# Patient Record
Sex: Female | Born: 1935 | Race: White | Hispanic: No
Health system: Southern US, Community
[De-identification: ages and names within clinical notes are randomized; demographics above are authoritative.]

---

## 2008-12-14 ENCOUNTER — Emergency Department: Payer: Self-pay | Admitting: Emergency Medicine

## 2009-10-11 ENCOUNTER — Emergency Department: Payer: Self-pay | Admitting: Emergency Medicine

## 2010-09-04 ENCOUNTER — Inpatient Hospital Stay: Payer: Self-pay | Admitting: *Deleted

## 2011-03-22 ENCOUNTER — Emergency Department: Payer: Self-pay | Admitting: Emergency Medicine

## 2011-03-22 LAB — BASIC METABOLIC PANEL
Anion Gap: 11 (ref 7–16)
BUN: 28 mg/dL — ABNORMAL HIGH (ref 7–18)
Calcium, Total: 8.4 mg/dL — ABNORMAL LOW (ref 8.5–10.1)
Chloride: 103 mmol/L (ref 98–107)
Glucose: 120 mg/dL — ABNORMAL HIGH (ref 65–99)
Osmolality: 297 (ref 275–301)
Potassium: 3.8 mmol/L (ref 3.5–5.1)

## 2011-03-22 LAB — CBC
HCT: 30.4 % — ABNORMAL LOW (ref 35.0–47.0)
MCHC: 32.9 g/dL (ref 32.0–36.0)
RBC: 3.41 10*6/uL — ABNORMAL LOW (ref 3.80–5.20)
RDW: 15.9 % — ABNORMAL HIGH (ref 11.5–14.5)
WBC: 5.1 10*3/uL (ref 3.6–11.0)

## 2011-03-22 LAB — PROTIME-INR: Prothrombin Time: 26.6 secs — ABNORMAL HIGH (ref 11.5–14.7)

## 2011-04-10 LAB — COMPREHENSIVE METABOLIC PANEL
Anion Gap: 12 (ref 7–16)
Bilirubin,Total: 0.6 mg/dL (ref 0.2–1.0)
Co2: 26 mmol/L (ref 21–32)
EGFR (Non-African Amer.): 44 — ABNORMAL LOW
Glucose: 145 mg/dL — ABNORMAL HIGH (ref 65–99)
SGOT(AST): 38 U/L — ABNORMAL HIGH (ref 15–37)
SGPT (ALT): 26 U/L
Sodium: 143 mmol/L (ref 136–145)

## 2011-04-10 LAB — CBC
HCT: 33 % — ABNORMAL LOW (ref 35.0–47.0)
Platelet: 139 10*3/uL — ABNORMAL LOW (ref 150–440)
RBC: 3.61 10*6/uL — ABNORMAL LOW (ref 3.80–5.20)
RDW: 16.5 % — ABNORMAL HIGH (ref 11.5–14.5)
WBC: 6.4 10*3/uL (ref 3.6–11.0)

## 2011-04-10 LAB — CK TOTAL AND CKMB (NOT AT ARMC)
CK, Total: 61 U/L (ref 21–215)
CK-MB: 1.5 ng/mL (ref 0.5–3.6)

## 2011-04-11 ENCOUNTER — Inpatient Hospital Stay: Payer: Self-pay | Admitting: Internal Medicine

## 2011-04-11 DIAGNOSIS — I5023 Acute on chronic systolic (congestive) heart failure: Secondary | ICD-10-CM

## 2011-04-11 DIAGNOSIS — I4892 Unspecified atrial flutter: Secondary | ICD-10-CM

## 2011-04-11 LAB — COMPREHENSIVE METABOLIC PANEL
Alkaline Phosphatase: 60 U/L (ref 50–136)
Bilirubin,Total: 0.7 mg/dL (ref 0.2–1.0)
Calcium, Total: 8.8 mg/dL (ref 8.5–10.1)
Chloride: 100 mmol/L (ref 98–107)
Co2: 32 mmol/L (ref 21–32)
EGFR (African American): 47 — ABNORMAL LOW
Osmolality: 287 (ref 275–301)
SGPT (ALT): 24 U/L
Total Protein: 6.8 g/dL (ref 6.4–8.2)

## 2011-04-11 LAB — CK TOTAL AND CKMB (NOT AT ARMC)
CK, Total: 42 U/L (ref 21–215)
CK, Total: 47 U/L (ref 21–215)
CK-MB: 1.5 ng/mL (ref 0.5–3.6)

## 2011-04-12 LAB — CBC WITH DIFFERENTIAL/PLATELET
Basophil #: 0 10*3/uL (ref 0.0–0.1)
Basophil %: 0.3 %
Eosinophil #: 0 10*3/uL (ref 0.0–0.7)
Eosinophil %: 0.1 %
Lymphocyte %: 16.9 %
Monocyte %: 7.7 %
Neutrophil #: 8.1 10*3/uL — ABNORMAL HIGH (ref 1.4–6.5)
RDW: 16.4 % — ABNORMAL HIGH (ref 11.5–14.5)
WBC: 10.8 10*3/uL (ref 3.6–11.0)

## 2011-04-12 LAB — BASIC METABOLIC PANEL
Anion Gap: 8 (ref 7–16)
BUN: 31 mg/dL — ABNORMAL HIGH (ref 7–18)
Calcium, Total: 9 mg/dL (ref 8.5–10.1)
Chloride: 98 mmol/L (ref 98–107)
Co2: 35 mmol/L — ABNORMAL HIGH (ref 21–32)
EGFR (Non-African Amer.): 25 — ABNORMAL LOW
Glucose: 120 mg/dL — ABNORMAL HIGH (ref 65–99)
Osmolality: 289 (ref 275–301)
Potassium: 4.3 mmol/L (ref 3.5–5.1)
Sodium: 141 mmol/L (ref 136–145)

## 2011-04-12 LAB — MAGNESIUM: Magnesium: 2.2 mg/dL

## 2011-04-12 LAB — TSH: Thyroid Stimulating Horm: 5.42 u[IU]/mL — ABNORMAL HIGH

## 2011-04-13 LAB — BASIC METABOLIC PANEL
BUN: 37 mg/dL — ABNORMAL HIGH (ref 7–18)
Co2: 31 mmol/L (ref 21–32)
EGFR (Non-African Amer.): 26 — ABNORMAL LOW
Glucose: 136 mg/dL — ABNORMAL HIGH (ref 65–99)
Osmolality: 286 (ref 275–301)
Potassium: 4.5 mmol/L (ref 3.5–5.1)
Sodium: 138 mmol/L (ref 136–145)

## 2011-04-14 LAB — BASIC METABOLIC PANEL
BUN: 35 mg/dL — ABNORMAL HIGH (ref 7–18)
Calcium, Total: 8.6 mg/dL (ref 8.5–10.1)
Chloride: 98 mmol/L (ref 98–107)
Co2: 32 mmol/L (ref 21–32)
EGFR (African American): 36 — ABNORMAL LOW
Osmolality: 284 (ref 275–301)

## 2011-06-03 ENCOUNTER — Emergency Department: Payer: Self-pay | Admitting: Emergency Medicine

## 2011-06-03 LAB — URINALYSIS, COMPLETE
Bacteria: NONE SEEN
Bilirubin,UR: NEGATIVE
Nitrite: NEGATIVE
Protein: NEGATIVE
RBC,UR: 5 /HPF (ref 0–5)
Squamous Epithelial: <1
WBC UR: 1 /HPF (ref 0–5)

## 2011-06-03 LAB — COMPREHENSIVE METABOLIC PANEL
Alkaline Phosphatase: 71 U/L (ref 50–136)
Anion Gap: 11 (ref 7–16)
Bilirubin,Total: 0.4 mg/dL (ref 0.2–1.0)
Calcium, Total: 8.6 mg/dL (ref 8.5–10.1)
Chloride: 101 mmol/L (ref 98–107)
Glucose: 133 mg/dL — ABNORMAL HIGH (ref 65–99)
Osmolality: 283 (ref 275–301)
SGOT(AST): 40 U/L — ABNORMAL HIGH (ref 15–37)
SGPT (ALT): 28 U/L
Total Protein: 7 g/dL (ref 6.4–8.2)

## 2011-06-03 LAB — CBC
HCT: 32.8 % — ABNORMAL LOW (ref 35.0–47.0)
HGB: 10.7 g/dL — ABNORMAL LOW (ref 12.0–16.0)
MCH: 29.7 pg (ref 26.0–34.0)
MCHC: 32.7 g/dL (ref 32.0–36.0)
MCV: 91 fL (ref 80–100)
RBC: 3.62 10*6/uL — ABNORMAL LOW (ref 3.80–5.20)
WBC: 7 10*3/uL (ref 3.6–11.0)

## 2011-07-04 ENCOUNTER — Ambulatory Visit: Payer: Self-pay | Admitting: Internal Medicine

## 2011-07-28 ENCOUNTER — Emergency Department: Payer: Self-pay | Admitting: Emergency Medicine

## 2011-07-30 ENCOUNTER — Inpatient Hospital Stay: Payer: Self-pay | Admitting: Internal Medicine

## 2011-07-30 LAB — COMPREHENSIVE METABOLIC PANEL
Anion Gap: 10 (ref 7–16)
BUN: 30 mg/dL — ABNORMAL HIGH (ref 7–18)
Bilirubin,Total: 0.5 mg/dL (ref 0.2–1.0)
Chloride: 105 mmol/L (ref 98–107)
Creatinine: 2.52 mg/dL — ABNORMAL HIGH (ref 0.60–1.30)
EGFR (African American): 19 — ABNORMAL LOW
EGFR (Non-African Amer.): 16 — ABNORMAL LOW
Glucose: 110 mg/dL — ABNORMAL HIGH (ref 65–99)
Potassium: 4.3 mmol/L (ref 3.5–5.1)
SGPT (ALT): 22 U/L
Total Protein: 7 g/dL (ref 6.4–8.2)

## 2011-07-30 LAB — CBC
MCH: 29.3 pg (ref 26.0–34.0)
MCHC: 33.7 g/dL (ref 32.0–36.0)
MCV: 87 fL (ref 80–100)
Platelet: 141 10*3/uL — ABNORMAL LOW (ref 150–440)
RBC: 3.41 10*6/uL — ABNORMAL LOW (ref 3.80–5.20)
WBC: 6.1 10*3/uL (ref 3.6–11.0)

## 2011-07-30 LAB — URINALYSIS, COMPLETE
Bilirubin,UR: NEGATIVE
Blood: NEGATIVE
Glucose,UR: NEGATIVE mg/dL (ref 0–75)
Leukocyte Esterase: NEGATIVE
Specific Gravity: 1.014 (ref 1.003–1.030)
Squamous Epithelial: <1
WBC UR: 2 /HPF (ref 0–5)

## 2011-07-31 LAB — COMPREHENSIVE METABOLIC PANEL
Albumin: 3.5 g/dL (ref 3.4–5.0)
Alkaline Phosphatase: 73 U/L (ref 50–136)
Anion Gap: 11 (ref 7–16)
BUN: 29 mg/dL — ABNORMAL HIGH (ref 7–18)
Bilirubin,Total: 0.5 mg/dL (ref 0.2–1.0)
Calcium, Total: 8.7 mg/dL (ref 8.5–10.1)
Chloride: 105 mmol/L (ref 98–107)
EGFR (African American): 20 — ABNORMAL LOW
EGFR (Non-African Amer.): 18 — ABNORMAL LOW
Glucose: 182 mg/dL — ABNORMAL HIGH (ref 65–99)
Osmolality: 292 (ref 275–301)
Potassium: 4.1 mmol/L (ref 3.5–5.1)
SGOT(AST): 42 U/L — ABNORMAL HIGH (ref 15–37)
Sodium: 141 mmol/L (ref 136–145)
Total Protein: 6.8 g/dL (ref 6.4–8.2)

## 2011-07-31 LAB — CBC WITH DIFFERENTIAL/PLATELET
Basophil #: 0 10*3/uL (ref 0.0–0.1)
Basophil %: 0.2 %
Eosinophil #: 0 10*3/uL (ref 0.0–0.7)
Eosinophil %: 0.1 %
HCT: 27.3 % — ABNORMAL LOW (ref 35.0–47.0)
HGB: 9.4 g/dL — ABNORMAL LOW (ref 12.0–16.0)
Lymphocyte #: 0.6 10*3/uL — ABNORMAL LOW (ref 1.0–3.6)
Lymphocyte %: 19.2 %
MCH: 30.1 pg (ref 26.0–34.0)
MCHC: 34.6 g/dL (ref 32.0–36.0)
MCV: 87 fL (ref 80–100)
Monocyte #: 0 x10 3/mm — ABNORMAL LOW (ref 0.2–0.9)
Monocyte %: 1.2 %
Neutrophil #: 2.3 10*3/uL (ref 1.4–6.5)
Neutrophil %: 79.3 %
RBC: 3.14 10*6/uL — ABNORMAL LOW (ref 3.80–5.20)
WBC: 2.9 10*3/uL — ABNORMAL LOW (ref 3.6–11.0)

## 2011-08-01 LAB — CBC WITH DIFFERENTIAL/PLATELET
Basophil #: 0 10*3/uL (ref 0.0–0.1)
Basophil %: 0.1 %
Eosinophil #: 0 10*3/uL (ref 0.0–0.7)
Eosinophil %: 0.1 %
HCT: 27.3 % — ABNORMAL LOW (ref 35.0–47.0)
HGB: 9.4 g/dL — ABNORMAL LOW (ref 12.0–16.0)
Lymphocyte %: 8 %
Monocyte %: 3.4 %
Neutrophil #: 6.5 10*3/uL (ref 1.4–6.5)
Neutrophil %: 88.4 %
RBC: 3.13 10*6/uL — ABNORMAL LOW (ref 3.80–5.20)
WBC: 7.4 10*3/uL (ref 3.6–11.0)

## 2011-08-01 LAB — BASIC METABOLIC PANEL
BUN: 32 mg/dL — ABNORMAL HIGH (ref 7–18)
Chloride: 103 mmol/L (ref 98–107)
Creatinine: 2.07 mg/dL — ABNORMAL HIGH (ref 0.60–1.30)
EGFR (Non-African Amer.): 21 — ABNORMAL LOW
Glucose: 183 mg/dL — ABNORMAL HIGH (ref 65–99)
Potassium: 3.8 mmol/L (ref 3.5–5.1)
Sodium: 140 mmol/L (ref 136–145)

## 2011-08-04 ENCOUNTER — Ambulatory Visit: Payer: Self-pay | Admitting: Internal Medicine

## 2011-08-05 LAB — CULTURE, BLOOD (SINGLE)

## 2011-09-09 ENCOUNTER — Emergency Department: Payer: Self-pay | Admitting: Emergency Medicine

## 2011-12-30 ENCOUNTER — Emergency Department: Payer: Self-pay | Admitting: Emergency Medicine

## 2012-05-07 ENCOUNTER — Emergency Department: Payer: Self-pay | Admitting: Emergency Medicine

## 2012-05-07 LAB — PRO B NATRIURETIC PEPTIDE: B-Type Natriuretic Peptide: 1658 pg/mL — ABNORMAL HIGH (ref 0–450)

## 2012-05-07 LAB — CBC
RBC: 3.92 10*6/uL (ref 3.80–5.20)
RDW: 15.4 % — ABNORMAL HIGH (ref 11.5–14.5)
WBC: 7.1 10*3/uL (ref 3.6–11.0)

## 2012-05-07 LAB — COMPREHENSIVE METABOLIC PANEL
Albumin: 3.5 g/dL (ref 3.4–5.0)
Alkaline Phosphatase: 79 U/L (ref 50–136)
Anion Gap: 5 — ABNORMAL LOW (ref 7–16)
BUN: 22 mg/dL — ABNORMAL HIGH (ref 7–18)
Bilirubin,Total: 0.5 mg/dL (ref 0.2–1.0)
Creatinine: 1.43 mg/dL — ABNORMAL HIGH (ref 0.60–1.30)
EGFR (African American): 38 — ABNORMAL LOW
Glucose: 107 mg/dL — ABNORMAL HIGH (ref 65–99)
Potassium: 4.4 mmol/L (ref 3.5–5.1)
SGOT(AST): 29 U/L (ref 15–37)
SGPT (ALT): 16 U/L (ref 12–78)

## 2012-05-07 LAB — TROPONIN I: Troponin-I: <0.02 ng/mL

## 2012-05-13 LAB — CULTURE, BLOOD (SINGLE)

## 2012-06-15 ENCOUNTER — Emergency Department: Payer: Self-pay

## 2012-06-15 LAB — CBC
HCT: 33.8 % — ABNORMAL LOW (ref 35.0–47.0)
MCH: 30 pg (ref 26.0–34.0)
Platelet: 158 10*3/uL (ref 150–440)
RBC: 3.76 10*6/uL — ABNORMAL LOW (ref 3.80–5.20)

## 2012-06-15 LAB — COMPREHENSIVE METABOLIC PANEL
Albumin: 3.8 g/dL (ref 3.4–5.0)
Alkaline Phosphatase: 67 U/L (ref 50–136)
Anion Gap: 6 — ABNORMAL LOW (ref 7–16)
BUN: 23 mg/dL — ABNORMAL HIGH (ref 7–18)
Bilirubin,Total: 0.5 mg/dL (ref 0.2–1.0)
Co2: 30 mmol/L (ref 21–32)
Creatinine: 1.59 mg/dL — ABNORMAL HIGH (ref 0.60–1.30)
EGFR (African American): 33 — ABNORMAL LOW
Glucose: 132 mg/dL — ABNORMAL HIGH (ref 65–99)
SGOT(AST): 27 U/L (ref 15–37)
SGPT (ALT): 18 U/L (ref 12–78)
Sodium: 143 mmol/L (ref 136–145)
Total Protein: 6.6 g/dL (ref 6.4–8.2)

## 2012-06-15 LAB — TROPONIN I: Troponin-I: <0.02 ng/mL

## 2012-12-03 ENCOUNTER — Inpatient Hospital Stay: Payer: Self-pay | Admitting: Internal Medicine

## 2012-12-03 LAB — COMPREHENSIVE METABOLIC PANEL
Alkaline Phosphatase: 50 U/L
BUN: 39 mg/dL — ABNORMAL HIGH (ref 7–18)
Bilirubin,Total: 0.5 mg/dL (ref 0.2–1.0)
Calcium, Total: 8.5 mg/dL (ref 8.5–10.1)
Chloride: 105 mmol/L (ref 98–107)
Co2: 28 mmol/L (ref 21–32)
EGFR (African American): 37 — ABNORMAL LOW
Osmolality: 291 (ref 275–301)
SGPT (ALT): 18 U/L (ref 12–78)
Total Protein: 5.8 g/dL — ABNORMAL LOW (ref 6.4–8.2)

## 2012-12-03 LAB — URINALYSIS, COMPLETE
Bacteria: NONE SEEN
Bilirubin,UR: NEGATIVE
Glucose,UR: NEGATIVE mg/dL (ref 0–75)
Hyaline Cast: 2
Ketone: NEGATIVE
Leukocyte Esterase: NEGATIVE
Nitrite: NEGATIVE
Ph: 5 (ref 4.5–8.0)
Protein: NEGATIVE
RBC,UR: 16 /HPF (ref 0–5)
Specific Gravity: 1.016 (ref 1.003–1.030)
Squamous Epithelial: 1

## 2012-12-03 LAB — CBC
MCH: 29.3 pg (ref 26.0–34.0)
MCHC: 32.3 g/dL (ref 32.0–36.0)
MCV: 91 fL (ref 80–100)
Platelet: 180 10*3/uL (ref 150–440)

## 2012-12-03 LAB — CBC WITH DIFFERENTIAL/PLATELET
Eosinophil #: 0.1 10*3/uL (ref 0.0–0.7)
Eosinophil %: 0.7 %
HCT: 29.8 % — ABNORMAL LOW (ref 35.0–47.0)
Lymphocyte #: 2.9 10*3/uL (ref 1.0–3.6)
MCH: 30.8 pg (ref 26.0–34.0)
MCHC: 33.6 g/dL (ref 32.0–36.0)
MCV: 92 fL (ref 80–100)
Monocyte #: 1.5 x10 3/mm — ABNORMAL HIGH (ref 0.2–0.9)
Monocyte %: 7.7 %
Neutrophil %: 76.3 %
Platelet: 144 10*3/uL — ABNORMAL LOW (ref 150–440)
RDW: 14.5 % (ref 11.5–14.5)
WBC: 19.4 10*3/uL — ABNORMAL HIGH (ref 3.6–11.0)

## 2012-12-03 LAB — LIPASE, BLOOD: Lipase: 110 U/L (ref 73–393)

## 2012-12-03 LAB — PROTIME-INR: INR: 1

## 2012-12-03 LAB — TROPONIN I: Troponin-I: <0.02 ng/mL

## 2012-12-03 LAB — GASTROCCULT (ARMC)

## 2012-12-04 ENCOUNTER — Ambulatory Visit: Payer: Self-pay | Admitting: Internal Medicine

## 2012-12-04 LAB — CBC WITH DIFFERENTIAL/PLATELET
Basophil %: 0.2 %
Eosinophil %: 3.3 %
HGB: 9.1 g/dL — ABNORMAL LOW (ref 12.0–16.0)
Lymphocyte #: 2.4 10*3/uL (ref 1.0–3.6)
Lymphocyte %: 18.5 %
MCH: 30.7 pg (ref 26.0–34.0)
MCHC: 33.6 g/dL (ref 32.0–36.0)
MCV: 92 fL (ref 80–100)
Monocyte %: 7.6 %
Neutrophil #: 9.2 10*3/uL — ABNORMAL HIGH (ref 1.4–6.5)
Neutrophil %: 70.4 %
Platelet: 131 10*3/uL — ABNORMAL LOW (ref 150–440)
WBC: 13.1 10*3/uL — ABNORMAL HIGH (ref 3.6–11.0)

## 2012-12-04 LAB — BASIC METABOLIC PANEL
BUN: 32 mg/dL — ABNORMAL HIGH (ref 7–18)
Calcium, Total: 8.3 mg/dL — ABNORMAL LOW (ref 8.5–10.1)
Chloride: 108 mmol/L — ABNORMAL HIGH (ref 98–107)
Co2: 29 mmol/L (ref 21–32)
Creatinine: 1.34 mg/dL — ABNORMAL HIGH (ref 0.60–1.30)
EGFR (African American): 40 — ABNORMAL LOW
EGFR (Non-African Amer.): 35 — ABNORMAL LOW
Glucose: 101 mg/dL — ABNORMAL HIGH (ref 65–99)
Potassium: 3.9 mmol/L (ref 3.5–5.1)

## 2013-01-03 ENCOUNTER — Ambulatory Visit: Payer: Self-pay | Admitting: Internal Medicine

## 2013-06-05 IMAGING — CR DG CHEST 2V
1 series · 2 of 2 positions shown · non-contrast
Comparison: none

REASON FOR EXAM: f/u CHF
COMMENTS:

[Series 1: w chest pa · 0.14mm/px · 2 of 2 slices shown]
[im 1/2]
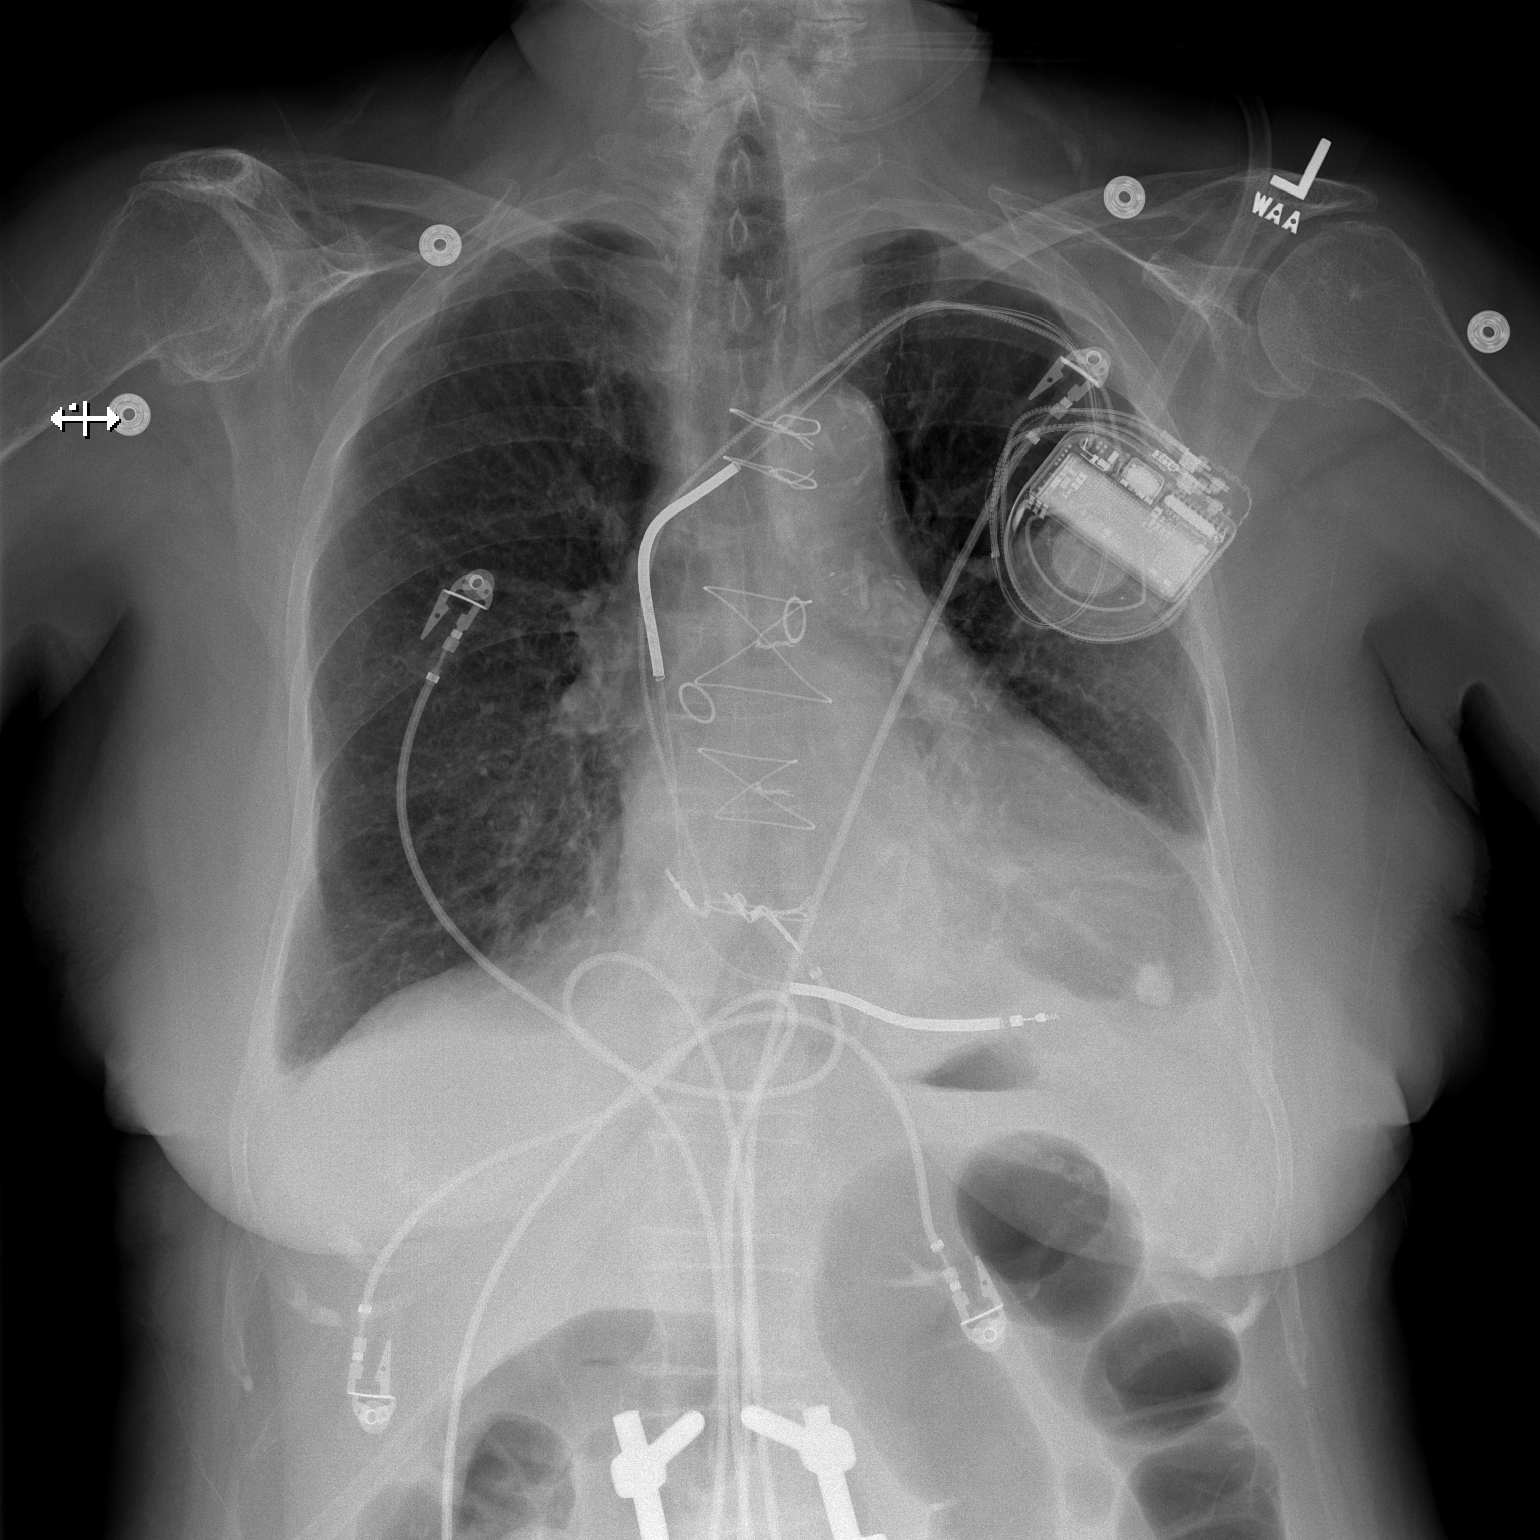
[im 2/2]
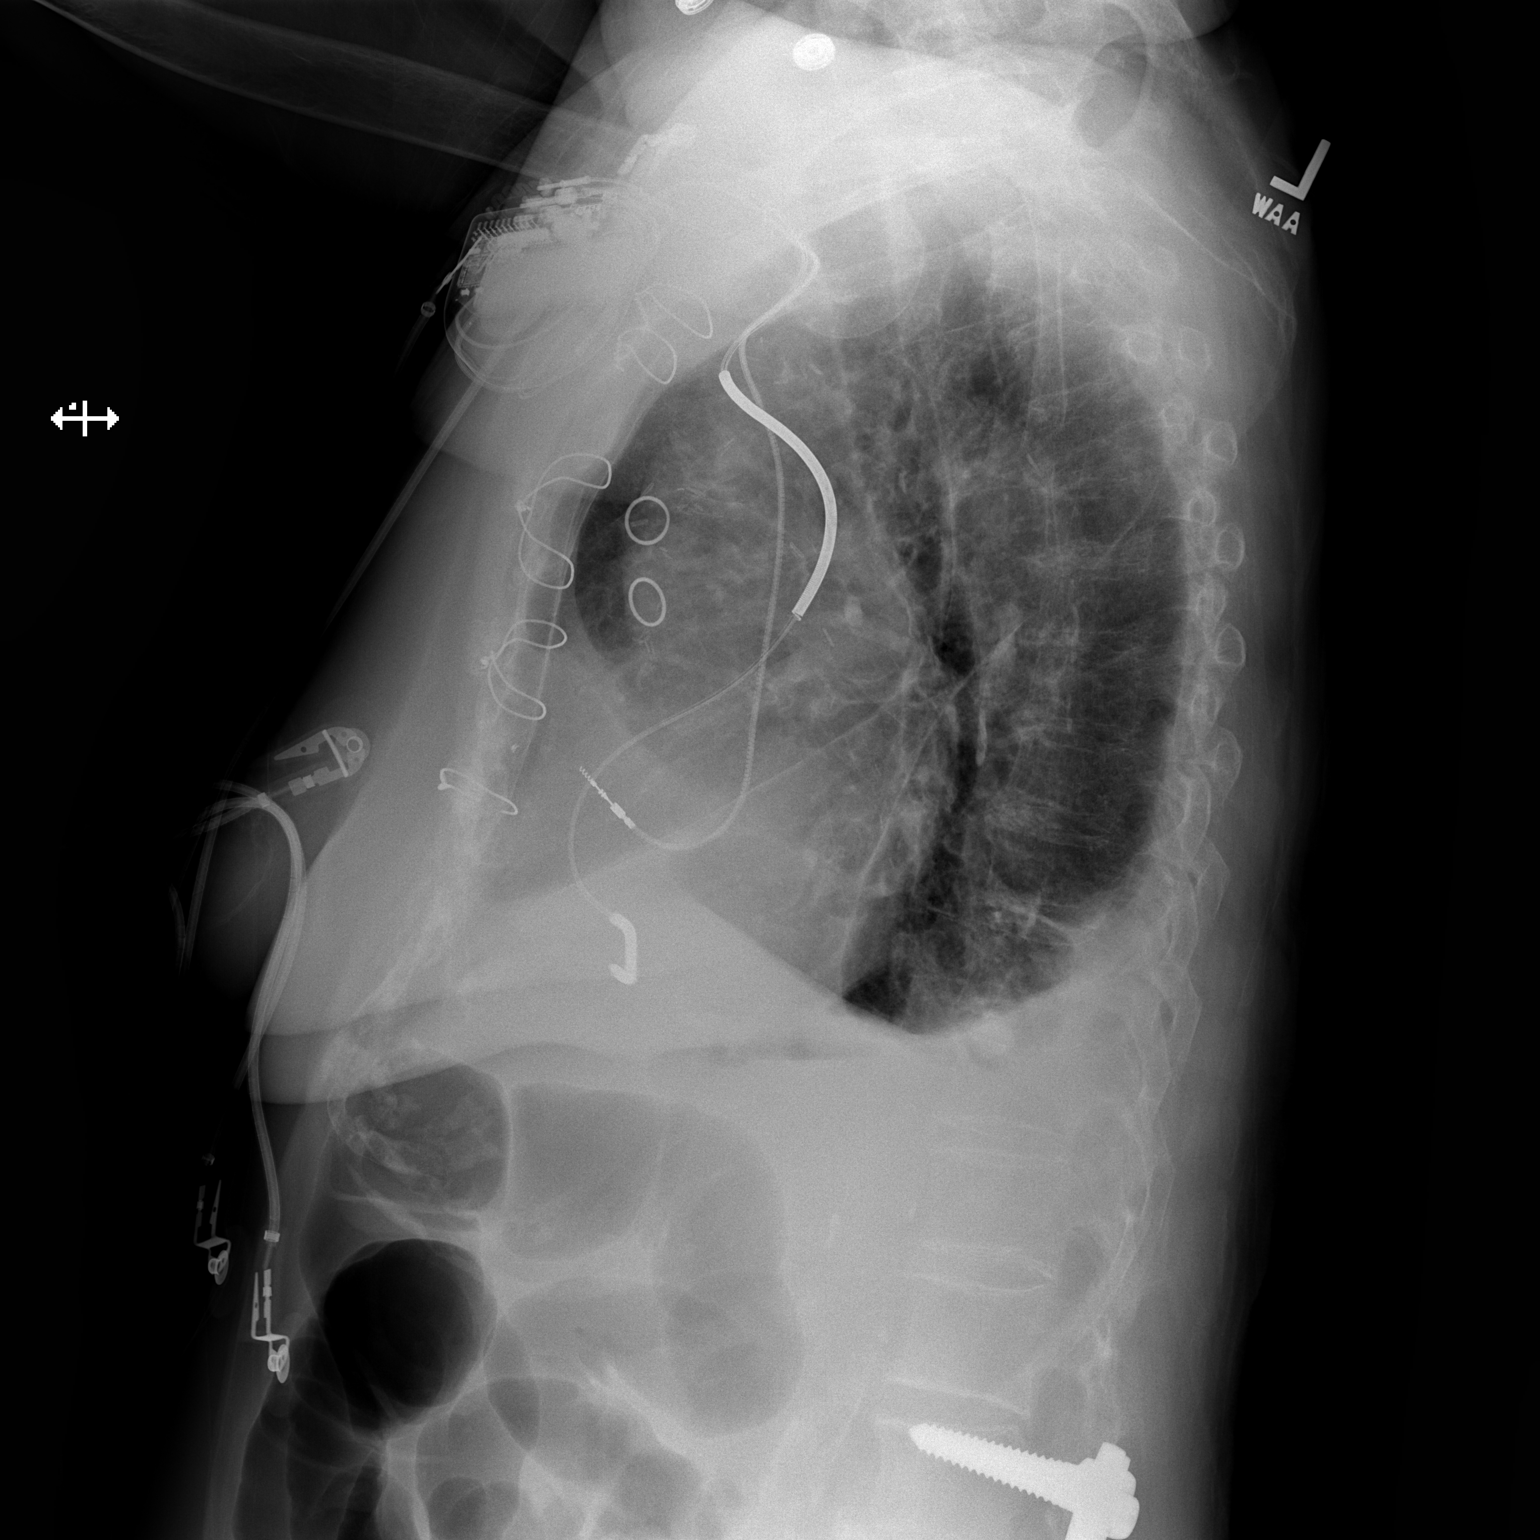

[2 of 2 positions shown; findings below may reference images not displayed]

PROCEDURE:     DXR - DXR CHEST PA (OR AP) AND LATERAL  - April 12, 2011 [DATE]

RESULT:     Comparison is made to the prior exam of 04/10/2011. There persists
small left pleural effusion that appears to have decreased as compared to
the exam of 04/10/2011. There is blunting of the right costophrenic angle
suspicious for a trace right effusion. There is again noted prominence of
the pulmonary vascularity compatible with pulmonary vascular congestion.
There has been little or no appreciable interval change since the prior
exam. No pneumonia is seen. There is mild stable cardiac enlargement.
Postoperative changes of prior CABG are noted. A cardiac pacemaker is
present.
IMPRESSION: 1. There are again noted mild changes of pulmonary vascular congestion.
2. There are small bibasilar pleural effusions with that on the left
appearing smaller than on the prior exam.
3. There is mild stable cardiac enlargement.
4. No definite pneumonia is identified.

## 2013-07-27 IMAGING — CT CT CERVICAL SPINE WITHOUT CONTRAST
1 series · 12 of 14 positions shown, 15 images · non-contrast
Comparison: none

REASON FOR EXAM: trauma, neck pain
COMMENTS:

PROCEDURE:     CT  - CT CERVICAL SPINE WO  - June 03, 2011  [DATE]
RESULT:     Comparison: 12/14/2008
TECHNIQUE: Multiple axial CT images were obtained of the cervical spine,
without intravenous contrast.  Sagittal and coronal reformatted images were
constructed.

[Series 7: axial · axial · 0.33mm/px · z∈[+152,+288]mm · 12 of 93 slices shown, 15 images]
[im 8/93  soft-tissue]
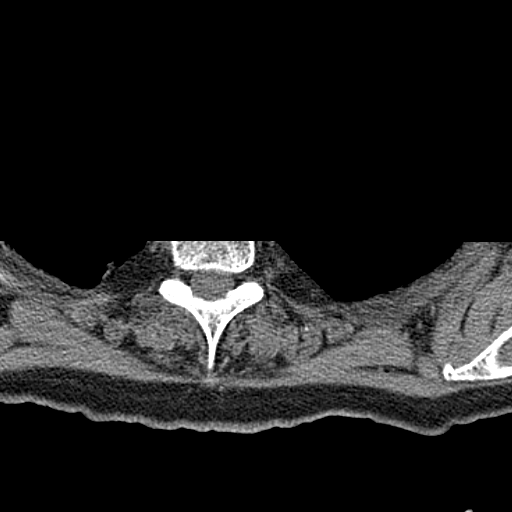
[im 8/93  bone]
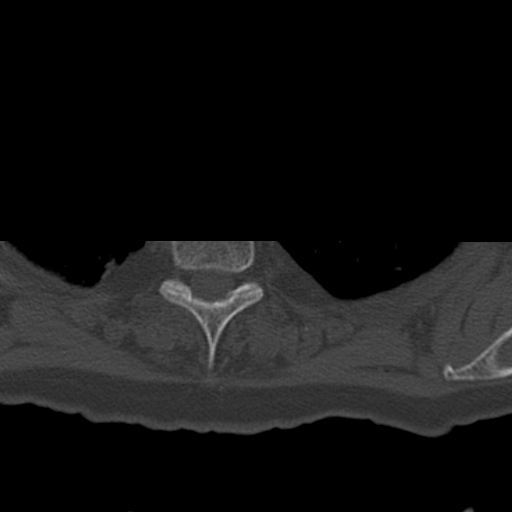
[im 15/93  bone]
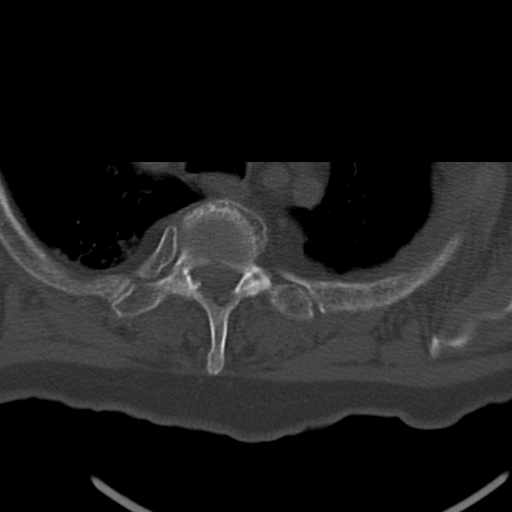
[im 22/93  bone]
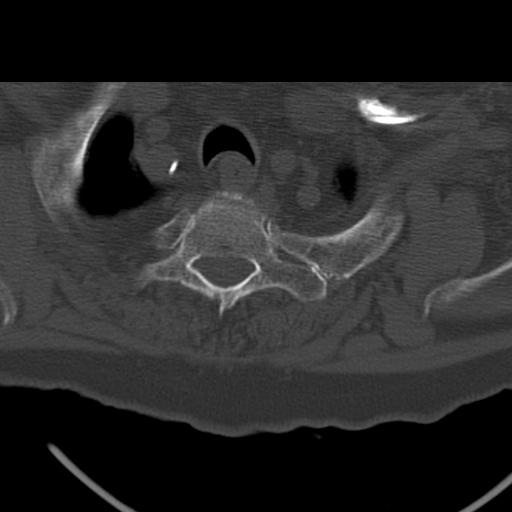
[im 29/93  bone]
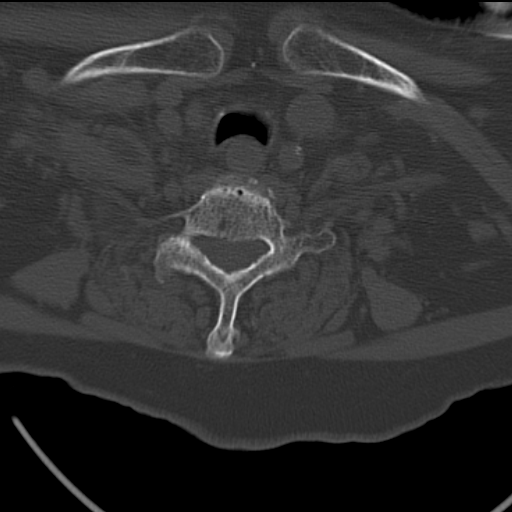
[im 36/93  soft-tissue]
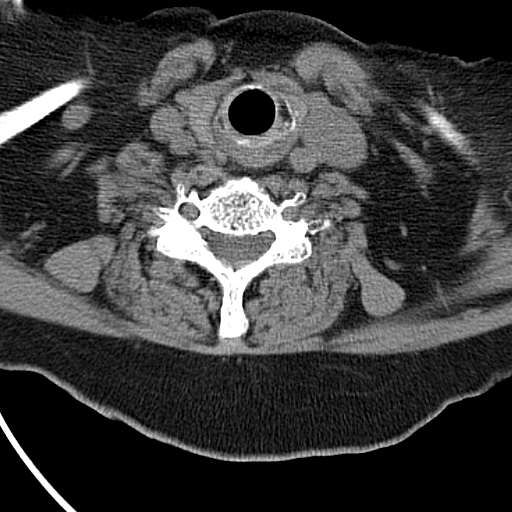
[im 36/93  bone]
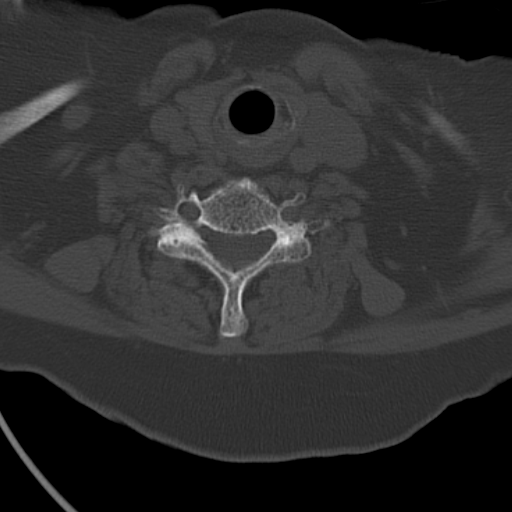
[im 43/93  bone]
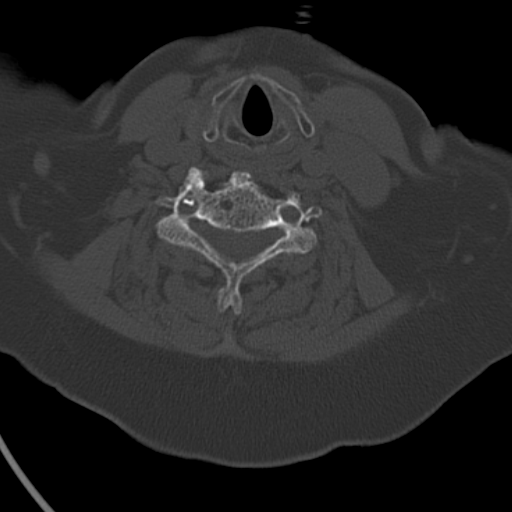
[im 50/93  bone]
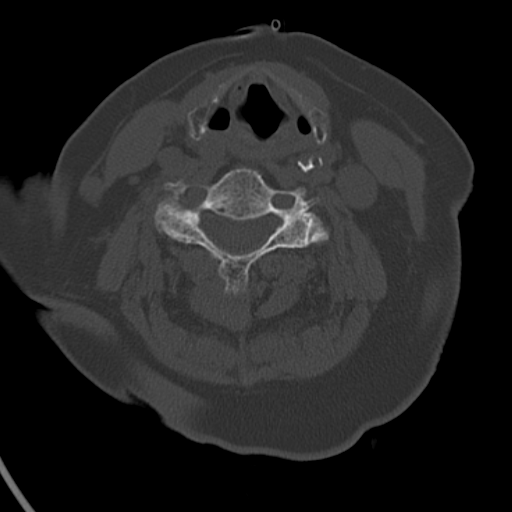
[im 57/93  bone]
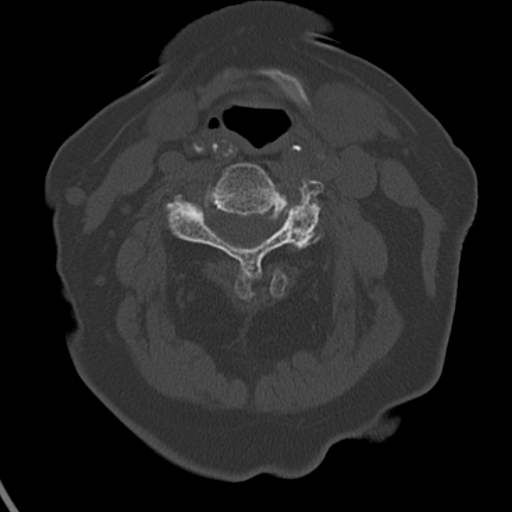
[im 64/93  soft-tissue]
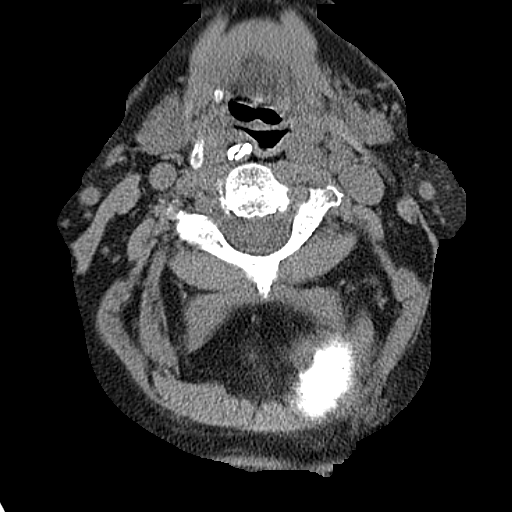
[im 64/93  bone]
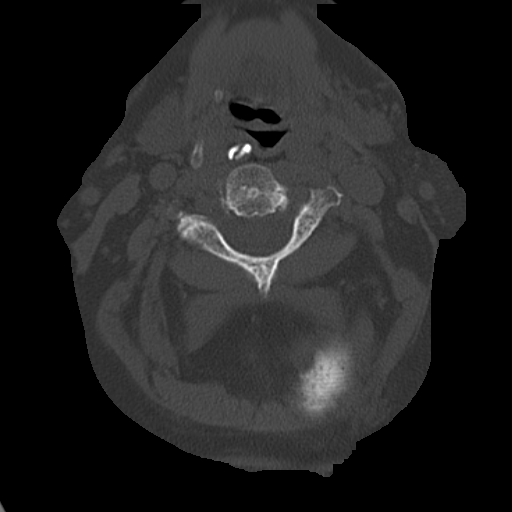
[im 71/93  bone]
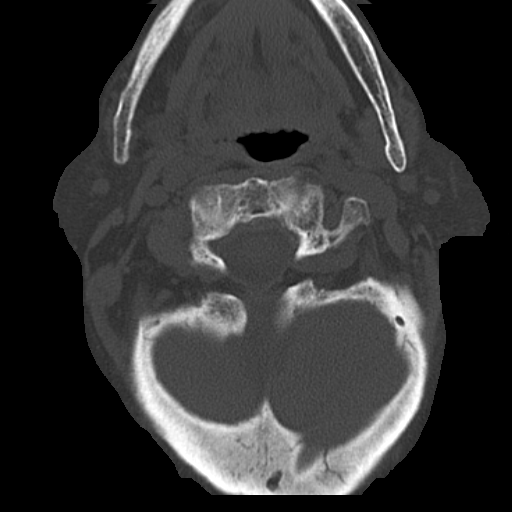
[im 78/93  bone]
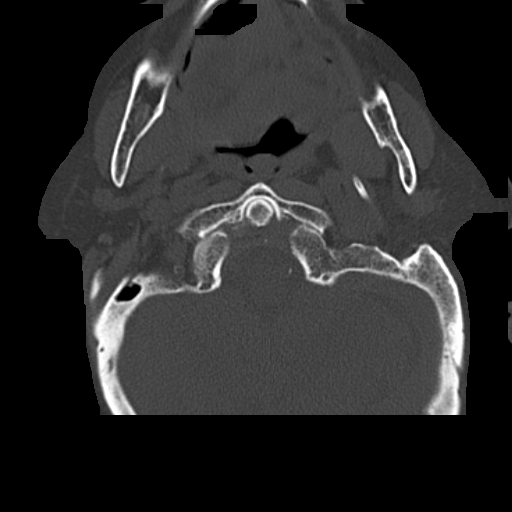
[im 85/93  bone]
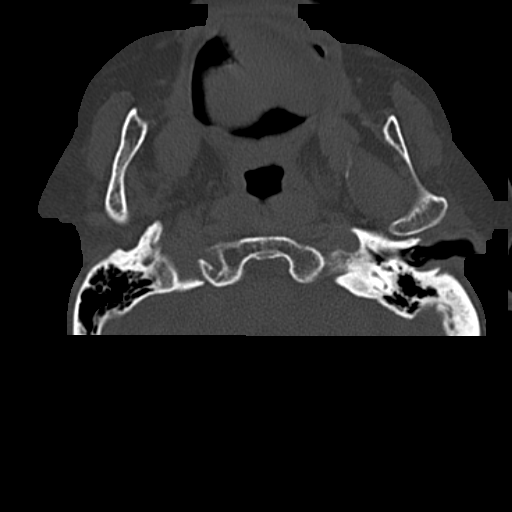

[12 of 14 positions shown; findings below may reference images not displayed]

FINDINGS: Minimal anterolisthesis of C3 on C4, C4 on C5 and C7 on T1 as well as
minimal retrolisthesis of C5 on C6 are similar to prior. No cervical spine
fracture identified. The prevertebral soft tissues are within normal limits.
There are calcifications in the coronary arteries. There is multilevel
degenerative disc disease.
IMPRESSION: No cervical spine fracture identified. Ligamentous injury is not excluded.

## 2014-01-03 DEATH — deceased

## 2014-04-22 NOTE — Discharge Summary (Signed)
PATIENT NAME:  Amber ComptonLEMA, Ece R MR#:  [id number] DATE OF BIRTH:  [birth year]  DATE OF ADMISSION:  07/30/2011 DATE OF DISCHARGE:  08/02/2011  DISCHARGE DIAGNOSES:  1. Delirium possibly due to medications and hypoxia. 2. Acute on chronic respiratory failure secondary to chronic obstructive pulmonary disease exacerbation.  3. Mild systolic congestive heart failure. 4. Acute renal failure on chronic kidney disease.  5. Coronary artery disease/ischemic cardiomyopathy. 6. Paroxysmal atrial fibrillation. 7. Hypothyroidism. 8. Gout. 9. Recurrent falls.   DISPOSITION: The patient is being discharged home with hospice.   DIET: Low sodium.   ACTIVITY: As tolerated.   DISCHARGE FOLLOWUP: Follow up with PCP in 1 to 2 weeks after discharge.   DISCHARGE MEDICATIONS:  1. Prednisone taper. 2. Levaquin 250 mg every 48 hours x3 doses. 3. Ocuvite lutein one tablet twice a day. 4. Symbicort 2 puffs twice a day. 5. Spiriva 18 mcg inhaled daily.  6. Nitroglycerin p.r.n.  7. Ergocalciferol 50,000 international units one 1 capsule once a week on Mondays. 8. DuoNebs every four hours p.r.n. shortness of breath.  9. Calcium 600  plus vitamin D 1 tablet daily. 10. Atrovent HFA 2 puffs twice a day. 11. Ventolin 2 puffs four times a day as needed for shortness of breath. 12. Morphine 30 mg 1/2 tablet 1 to 2 tablets a week as needed for pain. 13. Oxycodone 5 mg two to three times a day as needed.  14. Cardizem 120 mg daily.  15. Levoxyl 25 mcg daily.  16. Lyrica 50 mg twice a day. 17. Paxil 25 mg daily.  18. Colchicine 0.6 mg daily.  19. Lasix 40 mg daily.  20. Lipitor 10 mg daily. 21. Xarelto 50 mg daily.  22. Scopolamine 50 mcg daily.  23. Protonix 40 mg daily.  24. Citalopram 20 mg daily.  25. Detrol LA 4 mg daily.   CONSULTANTS: Ned GraceNancy Phifer, MD - Palliative Care.  RESULTS: EKG: Electronic pacemaker.   Chest x-ray: No acute abnormalities.   MICROBIOLOGY: Blood and urine cultures have been  negative so far.   CBC showed hemoglobin of 9.4, platelet count ranging from 141 to 125, and white count 7.4. Creatinine 2.52 to 2.07. Rest of complete metabolic panel normal.   HOSPITAL COURSE: The patient is an [age] year old female with past medical history of chronic respiratory failure on home oxygen, history of systolic congestive heart failure, history of ventricular fibrillation cardiac arrest status post automatic implantable cardiac defibrillator, coronary artery disease, ischemic cardiomyopathy, paroxysmal atrial fibrillation, cerebrovascular accident, chronic kidney disease, and chronic pain syndrome on narcotics who presented with multiple falls and change in mental status. After her initial presentation, the patient was back to baseline. The etiology of her delirium was unclear, but it was possibly related to her medications since she took almost daily narcotics. She was also hypoxic on presentation. There was no evidence of any infection. Her blood and urine cultures were negative. Her CT of the head was also negative. She was found to have acute on chronic respiratory failure due to chronic obstructive pulmonary disease exacerbation. Her chest x-ray showed no infiltrate. She was treated with nebulizer treatments, Symbicort, empiric antibiotics, and a steroid taper.  She had possible mild systolic congestive heart failure on presentation and was treated with Lasix. She is not on any ACE inhibitor in view of her chronic kidney disease. She was found to have acute renal failure on chronic kidney disease possibly due to prerenal azotemia. Her baseline creatinine is 1.8 to 1.9. With gentle fluids there  was some improvement in her creatinine. The patient has a history of paroxysmal atrial fibrillation and was on Cardizem and beta blocker. Her beta blocker was held in view of her borderline low blood pressure. The patient had multiple comorbidities and was high risk for complications and cardiopulmonary  arrest. Extensive discussions were held with the patient's daughter, Elvia Collum, with whom the patient lived. The patient's daughter requested hospice. The patient was not very mobile and was increasingly chair and bedbound. The risks versus benefits of Xarelto were discussed with the patient's daughter in detail. The patient has had a stroke in the past and has paroxysmal atrial fibrillation. She is also not very mobile and is at high risk of deep venous thrombosis and PE. The patient's daughter decided for the patient to continue taking Xarelto for the time being. If the patient continues to fall at home, she has been advised to stop taking the Xarelto. Code status was also discussed with the patient's daughter since the patient was a FULL CODE. She has very high risk of ventilator dependency in view of her advanced chronic obstructive pulmonary disease and oxygen dependency. The patient's daughter reported that she will discuss with her brother and reconsider the code status. Palliative care was consulted. Arrangements were made for the patient to be discharged home with hospice.   TIME SPENT: 45 minutes.  ____________________________ Darrick Meigs, MD sp:slb D: 08/02/2011 15:08:55 ET T: 08/03/2011 11:48:06 ET JOB#: [id number]  cc: Darrick Meigs, MD, <Dictator> Darrick Meigs MD ELECTRONICALLY SIGNED 08/03/2011 12:21

## 2014-04-22 NOTE — H&P (Signed)
PATIENT NAME:  Amber Meza, Amber Meza DATE OF BIRTH:  12-21-1920  DATE OF ADMISSION:  07/30/2011  PR[facility] CARE PH[facility]IC[facility] [place]  CHIEF COMPLAINT: Dyspnea, delirium, recurrent falls.   HISTORY OF PRESENT ILLNESS: Ms. Amber Meza is a delightful 81 year old white female with a history of chronic obstructive pulmonary disease-advanced, O2-requiring 24/7, coronary artery disease with ischemic cardiomyopathy, ejection fraction 30% by most recent echo, status post automatic implantable cardiac defibrillator in 1996 for ventricular fibrillation arrest, atrial fibrillation, with history of prior stroke on Xarelto, and chronic kidney disease as well as a chronic pain with daily use of narcotics, who was brought to the Emergency Room after family noticed an acute change in mental status which started three days ago. The patient has been having audio and visual hallucinations for several days. She has been falling daily in spite of 24-hour care by family, and this has been chronic for several months. She does use a walker at home but frequently for falls when transferring herself from commode to the walker, from the bed to the walker, etc. Her daughter has been sleeping on a couch near her but cannot be with her every second, and the family is needing additional help. The patient is covered in bruises from multiple falls. According to the family, she has fallen at least 10 times in the last several days. The patient does note that she has not been coughing very much lately. She does have chronic orthopnea but denies any chest pain. She does have shortness of breath with minimal exertion and becomes quite winded just going to the bathroom. The patient's family states that she was treated for a urinary tract infection by her primary care doctor 10 days ago with Keflex and is now taking Septra for persistent infection. She was also seen in the Emergency Room on July 25th two days prior to  admission for a fall with acute mental status changes and had a head CT which was negative for acute changes, and she was sent home.   PAST MEDICAL HISTORY:  1. Chronic obstructive pulmonary disease, advanced, on home O2.  2. History of tobacco use, quit in 1996.  3. History of ventricular fibrillation arrest in 1996.  4. Coronary artery disease with systolic dysfunction, ejection fraction 35 to 40% by prior echo.  5. Macular degeneration.  6. Chronic pain.  7. Chronic kidney disease, stage 3.  8. Hypothyroidism.  9. Gout. 10. History of stroke.   CURRENT MEDICATIONS:  1. Atorvastatin 10 mg daily.  2. Atrovent 2 puffs twice a day.  3. Calcium plus vitamin D 1 tab daily. 4. Citalopram 20 mg daily.  5. Colcrys 0.6 mg daily. 6. Cyanocobalamin 50 mcg daily.  7. Detrol LA 4 mg daily.  8. Diltiazem CD 120 mg daily  9. DuoNebs via nebulizer 4 times a day as needed for shortness of breath. 10. Drisdol 50,000 units once a week on Mondays.  11. Furosemide 40 mg daily.  12. Levoxyl 25 mcg daily.  13. Lyrica 50 mg every 12 hours.  14. Metoprolol tartrate 25 mg twice a day  15. Morphine 30 mg tablet, 1/2 tablet 1 to 2 times a week as needed for pain.  16. Nitroglycerin sublingual tablet every 5 minutes as needed.  17. Ocuvite lutein twice a day.  18. Oxycodone 5 mg, 2-3 times a day as needed  19. Paroxetine extended release 25 mg daily.  20. Protonix 40 mg daily.  21. Spiriva  18 mcg inhaled via HandiHaler once a day.  22. Septra DS 1 tablet 2 times daily for 10 days for urinary tract infection. The patient recently finished this. 23. Symbicort 2 puffs twice a day.  24. Ventolin 2 puffs twice a day as needed.  25. Xarelto 50 mg once daily.  PAST SURGICAL HISTORY:  1. Status post total hip replacement x2. 2. Coronary artery bypass graft.  3. Automatic implantable cardiac defibrillator placement. 4. History of percutaneous transluminal coronary angioplasty/stent.   ALLERGIES: She has  an extensive list of allergies including doxycycline, which causes hives, Prilosec and Vioxx, which also caused a rash. The reaction to Feldene, clopidogrel, ACE inhibitors, alprazolam, atenolol, Lotensin, spironolactone is unknown.   HOSPITALIZATIONS: Last hospitalization was April 2013, a three-day stay for congestive heart failure exacerbation and acute on chronic obstructive pulmonary disease with hypoxic respiratory failure. Prior hospitalization to that was September 2012 for mental status changes and falls.   FAMILY HISTORY: Positive for coronary artery disease.   SOCIAL HISTORY: She is widowed. She lives with her son and daughter-in-law. She is an ex-smoker, quit after 50 years of smoking in 1996 at the time of her heart attack. She does not drink alcohol. She uses a walker for all ambulation.   REVIEW OF SYSTEMS: CONSTITUTIONAL: Positive for weakness. No weight changes, in fact her weight has been stable per congestive heart failure protocol. EYES: She has a history of decreased vision secondary to macular degeneration. ENT: She denies tinnitus, seasonal rhinitis, epistaxis, and sinus pain. RESPIRATORY: She denies a cough currently. She does report daily wheezing and dyspnea with minimal exertion. She denies painful respirations. CARDIOVASCULAR: No recent history of chest pain. Chronic orthopnea, three pillow. Periodic edema secondary to water retention but currently none. GASTROINTESTINAL: She has had some diarrhea which cleared about a week ago. No nausea, vomiting, or abdominal pain. GENITOURINARY: She denies currently any dysuria or hematuria but does have a history of incontinence. No polyuria or nocturia. ENDOCRINE: History of thyroid problems managed with medication. HEMATOLOGIC: No history of known anemia. She does have easy bruising secondary to use of Xarelto, aspirin and frequent falls. MUSCULOSKELETAL: She has chronic pain in her hips and lower back which is no worse than usual.  NEUROLOGICAL:  She denies any numbness, weakness, dysarthria, headache or seizures. She does have a history of a CVA. PSYCHIATRIC: She has a history of anxiety and depression managed with Celexa and Paxil.   PHYSICAL EXAMINATION:  GENERAL: This is an elderly female who is awake, alert and responsive. She is short of breath.   VITAL SIGNS: Blood pressure 133/69, pulse 76, rhythm is regular, temperature is 97.1. She apparently was sating 97% on 2 liters.   HEENT: Pupils are equal, round, and reactive to light. Extraocular movements are intact. Sclerae are anicteric. Oropharynx is benign. She is edentulous.  NECK: Supple without lymphadenopathy, JVD, thyromegaly, or carotid bruits.   LUNGS: Notable for wheezing in all lung fields with very poor air movement.   CARDIOVASCULAR: Regular rate and rhythm. No chest wall tenderness. CABG surgical scar evident. No lower extremity edema. Pulses are palpable in the femoral and pedal areas.   ABDOMEN: Soft, nontender, nondistended. Good bowel sounds. No hepatosplenomegaly. She is moving all extremities. Strength was not tested. Gait was not tested due to frequent falls.   SKIN: Skin is warm and dry without rashes or lesions. She does have multiple, multiple bruises covering her back and extremities. She also has a healing black eye on the right.  There is no cervical, axillary, inguinal, or supraclavicular lymphadenopathy.   NEUROLOGICAL: Cranial nerves II through XII appear to be intact. Deep tendon reflexes are intact, and she is not dysarthric. She is alert and oriented to person and place. She is cooperative and does not appear confused.   ADMISSION LABORATORY, DIAGNOSTIC AND RADIOLOGICAL DATA: Sodium 141, potassium 4.3, chloride 105, bicarbonate 26, BUN 30, creatinine 2.52, glucose 110. White count 6.1, hemoglobin 10.0, platelets 141. Liver function tests normal except for AST of 42. Arterial blood gas 7.41, pCO2 42, pO2 50 on room air. Troponin I less  than 0.02. Magnesium 2.0. Portable chest x-ray: No pneumonia, possible decompensation versus chronic obstructive pulmonary disease exacerbation. EKG: Paced rhythm with ST segment depression in the anterior lateral leads. This is slightly more pronounced in the lateral leads compared to the May 31st EKG on file. Magnesium was 2.0. Urinalysis still pending.   ASSESSMENT AND PLAN:  1. Delirium secondary to acute hypoxia with pO2 in the 50s on room air: The source of her hypoxia appears to be an acute on chronic respiratory failure secondary to chronic obstructive pulmonary disease exacerbation with significant wheezing on exam. We will admit to a monitored bed for IV steroids, frequent bronchodilator therapy, and empiric antibiotics. Sputum cultures have been ordered. Blood cultures have been obtained in the ED. We will also rule out recurrent urinary tract infections as cause of her delirium given recent treatment for urinary tract infection with Keflex followed by Septra.  2. Mild congestive heart failure exacerbation possible: We will check BNP, but this is unlikely to be helpful in the setting of acute renal failure. She does not appear fluid overloaded on exam. I have ordered normal saline at 75 mL/hr for 1 liter given her altered mental status for the last several days and probable decreased p.o. intake. She normally takes 40 mg of Lasix daily. This has been ordered.  3. Acute renal failure: Again, likely secondary to prerenal azotemia. Baseline creatinine appears to be 1.8 to 1.9 by last hospital admission. Her potassium is normal. She is not on an ARB. Again, gentle hydration with daily weights and strict ins and outs will be ordered. If creatinine does not improve in 24 hours, we will consider Nephrology consult.  4. Systemic cardiomyopathy secondary to ischemic heart disease: She has had no chest pain, and her first troponin is 0.02. We will cycle these and follow her on telemetry given the lateral T  wave changes on EKG.  5. History of atrial fibrillation on chronic anticoagulation: At this point, given the number of falls she is having on a daily basis, I have discussed with the family that the risks of continuing to anticoagulate her are greater than the benefit, and they understand. We will be discontinuing Xarelto at this point. .  6. Generalized weakness with recurrent falls: I have asked Care Management to discuss possible placement with family versus  additional Home Health aide for the wife, who is providing most of the care. She will need, if she does go home, a hospital bed, a Hoyer lift, and may qualify for a scooter since she is falling despite use of the walker. I will leave that to the primary care physician to discuss use of scooter.   CODE STATUS: I had a conversation today with the patient and family. The patient requested to remain FULL CODE and understands that the active cardiopulmonary resuscitation could be quite injurious; however, she wants every chance to be given.   ESTIMATED  TIME OF CARE: 45 minutes. ____________________________ Duncan Dull, MD tt:cbb D: 07/30/2011 14:37:50 ET T: 07/30/2011 15:23:59 ET JOB#: [id number]  cc: Duncan Dull, MD, <Dictator> [facility] [place], Dr. Marcy Panning MD ELECTRONICALLY SIGNED 07/31/2011 15:35

## 2014-04-25 NOTE — Consult Note (Signed)
Brief Consult Note: Diagnosis: coffee ground emesis.   Patient was seen by consultant.   Consult note dictated.   Recommend to proceed with surgery or procedure.   Comments: Ms. Amber Meza is a pleasant 81 y/o female with multiple medical comorbidities who has chronic GERD not on PPI & on aspirin daily who has had coffee ground emesis for past "couple weeks".  Hgb 10.8 EGD tomorrow  with Dr Servando SnareWohl to look for PUD, gastritis, or esophagitis.  Discussed risks/benefits of procedure which include but are not limited to bleeding, infection, perforation & drug reaction.  Patient agrees with this plan & consent will be obtained.  Plan: 1) Agree with protonix gtt 2) clears, NPO after MN except meds 3) EGD tomorrow with Dr Servando SnareWOHL.  Endo to be made aware of pt's Defibrillator 4) Monitor H/H  Thanks for consult.  Please see full dictated note. #[id number].  Electronic Signatures: Joselyn ArrowJones, Tiquan Bouch L (NP)  (Signed 01-Dec-14 21:07)  Authored: Brief Consult Note   Last Updated: 01-Dec-14 21:07 by Joselyn ArrowJones, Samiyyah Moffa L (NP)

## 2014-04-25 NOTE — Consult Note (Signed)
   Comments   Josh Borders, NP, and I met with pt's daughter/HCPOA and son. Updated them on pt's current condition and plan for endoscopy. Family says that pt has had significant decline over the past months. They understand that her health status is tenuous. We discussed code status at length. Family agrees that pt should be a DNR. Order entered. Out-of-facility DNR completed and placed in chart.  plans for pt to return home at discharge with hospice continuing to follow. I talked to them about the option of deactivating pt's AICD if she continues to decline and is approaching end of life. They express understanding. Note pt's AICD was last interrogated 6 months ago.   Electronic Signatures: Obdulio Mash, Izora Gala (MD)  (Signed 02-Dec-14 12:47)  Authored: Palliative Care   Last Updated: 02-Dec-14 12:47 by Delwyn Scoggin, Izora Gala (MD)

## 2014-04-25 NOTE — Consult Note (Signed)
PATIENT NAME:  Amber Meza, Amber Meza#:  Meza DATE OF BIRTH:  [birth year]  DATE OF CONSULTATION:  12/03/2012  REFERRING PHYSICIAN:  Katha HammingSnehalatha Konidena, MD CONSULTING PHYSICIAN: Midge Miniumarren Wohl, MD; Joselyn ArrowKandice L. Talajah Slimp, NP PRIMARY CARE PHYSICIAN: [facility] Greggory [facility]allion[facility].  REASON FOR CONSULTATION: Coffee-ground emesis.   HISTORY OF PRESENT ILLNESS: [name] is a 81 year old Caucasian female who was in her usual state of health. She does have significant heart disease including systolic congestive heart failure, coronary artery disease, paroxysmal atrial fibrillation, ventricular fibrillation with history of cardiac arrest, ischemic cardiomyopathy and status post AICD. She also has oxygen-dependent COPD and thyroid disease, as well as chronic anemia and history of peptic ulcer disease. She tells me over the last couple of weeks she has noticed "specks of what appears to be coffee grounds" in her emesis. She has nausea and vomiting after every meal for the last couple of weeks. She denies any NSAIDs, but does take aspirin daily. She has been having heartburn all day long. She has tried "anything I can get my hands on." She believes she has tried Prevacid 30 mg daily. She occasionally has epigastric pain, which is 7 out of 10 on pain scale. directly after her meals. She denies any rectal bleeding, but has noted dark stools in the past several months. She was found to be Gastroccult positive. She has had chronic constipation all her life. She believes she has had a colonoscopy within the last year or two, as well as [facility]. She is unsure whether her weight has been stable. She tells me her appetite is so-so. Her hemoglobin is 10.8. Her white blood cell count is 20.9. Her platelet count is normal. INR is 1.   PAST MEDICAL AND SURGICAL HISTORY: Systolic congestive heart failure; she has an AICD; coronary artery disease, V. fib with cardiac arrest, ischemic  cardiomyopathy, paroxysmal atrial fibrillation, COPD that oxygen-dependent, hypothyroidism, CVA, chronic anemia, remote peptic ulcer disease.   MEDICATIONS PRIOR TO ADMISSION: Atorvastatin 10 mg at bedtime, citalopram 20 mg daily, Colcrys 0.6 mg b.i.d., Detrol LA 4 mg capsule daily, diltiazem 120 mg daily, furosemide 20 mg daily, Levoxyl 25 mcg daily, Lyrica 75 mg b.i.d., Ocuvite antioxidant multivitamins b.i.d., oxycodone 5 mg 1 to 2 tablets 2 to 3 times a day p.r.n., Percocet 5/325 mg every 8 hours p.r.n., Protonix 40 mg b.i.d., vitamin D3 daily.   ALLERGIES: ACE INHIBITORS, ALPRAZOLAM, ATENOLOL, PLAVIX; DOXYCYCLINE CAUSED HIVES; FELDENE, LOTENSIN, NSAIDS; PRILOSEC CAUSED RASH; SPIRONOLACTONE; VIOXX CAUSED RASH AND ZANTAC CAUSED GI DISTRESS.   FAMILY HISTORY: Positive for brother with colon cancer, but she cannot recall at what age.   SOCIAL HISTORY: She is a widow. She lives with her daughter. She is retired from [employer] business. She has 2 grown healthy children. She denies any tobacco, alcohol, or illicit drug use.   REVIEW OF SYSTEMS: See HPI.  CONSTITUTIONAL: She has had some weakness and fatigue and malaise.  PULMONARY: She had some shortness of breath on exertion.   Otherwise, negative 12-point review of systems.   PHYSICAL EXAMINATION: VITAL SIGNS: Temperature 98.3, pulse 82, respirations 20, blood pressure 125/57, O2 sat 97% on 2 liters via nasal cannula.  GENERAL: She is a well-developed, elderly Caucasian female who is alert, oriented, pleasant and cooperative, in no acute distress. She is pale.  HEENT: Sclerae clear, anicteric. Conjunctivae pale. Oropharynx pink and moist. She is edentulous. She has tongue rolling. NECK: Supple without any mass or thyromegaly.  CHEST: She has a defibrillator, left upper anterior chest.  HEART: Irregularly irregular.  LUNGS: With mild expiratory wheezes bilaterally. No acute distress.  ABDOMEN: Positive bowel sounds x 4. Multiple small  ecchymoses from injection sites. Abdomen is soft, nontender, nondistended, without palpable mass or hepatosplenomegaly. No rebound tenderness or guarding. Exam is limited given patient's body habitus.  EXTREMITIES: Without edema or cyanosis.  SKIN: Pale, warm and dry.  NEUROLOGIC: Grossly intact.  MUSCULOSKELETAL: Good equal movement and strength bilaterally.  PSYCHIATRIC: She has normal mood and affect.   LABORATORY STUDIES: Total protein 5.8, albumin 3, otherwise normal LFTs. Lipase 110. Glucose 147, BUN 39, creatinine 1.45, otherwise normal metabolic panel. Platelet count is normal.   IMPRESSION: Amber Meza is a pleasant 81 year old female with multiple comorbidities who has chronic gastroesophageal reflux disease, not on PPI and on aspirin daily, who has had coffee-ground emesis for the past couple of weeks. Hemoglobin 10.8. EGD tomorrow with Dr. Servando Snare to look for peptic ulcer disease, gastritis or esophagitis. Discussed risks and benefits of the procedure, which include but are not limited to bleeding, infection, perforation, drug reaction. She agrees with plan and consent will be obtained.   PLAN: 1.  Agree with Protonix drip.  2.  Clear liquid diet. N.p.o. after midnight except medications.  3.  EGD tomorrow with Dr. Servando Snare. Endo to be made aware of patient's defibrillator.  4.  Monitor hemoglobin and hematocrit.   Thank you for allowing [place] to participate in the care of Amber Meza.   ____________________________ Joselyn Arrow, NP klj:jcm D: 12/03/2012 21:16:13 ET T: 12/03/2012 21:45:05 ET JOB#: [id number]  cc: Joselyn Arrow, NP, <Dictator> Joselyn Arrow FNP ELECTRONICALLY SIGNED 12/10/2012 15:39

## 2014-04-25 NOTE — Consult Note (Signed)
Chief Complaint:  Subjective/Chief Complaint Pt denies any hematemesis, nausea, vomiting or abdominal pain.   VITAL SIGNS/ANCILLARY NOTES: **Vital Signs.:   03-Dec-14 12:21  Temperature Temperature (F) 98.3  Celsius 36.8  Temperature Source oral  Pulse Pulse 78  Respirations Respirations 18  Systolic BP Systolic BP 120  Diastolic BP (mmHg) Diastolic BP (mmHg) 73  Mean BP 88  Pulse Ox Activity Level  At rest  Oxygen Delivery 2L   Brief Assessment:  GEN well developed, no acute distress, A/Ox3. Daughter @ bedside.   Cardiac Regular   Gastrointestinal Normal   Gastrointestinal details normal Soft  Nondistended  Bowel sounds normal  No rebound tenderness  No gaurding  +mild LUQ tenderness   EXTR negative cyanosis/clubbing, negative edema   Additional Physical Exam Skin: pink, warm, dry   Lab Results: Routine Hem:  03-Dec-14 04:23   Hemoglobin (CBC)  8.1 (Result(s) reported on 05 Dec 2012 at 05:19AM.)   Assessment/Plan:  Assessment/Plan:  Assessment Hematemesis:  EGD benign HH.  No source of hematemesis.   Anemia: Hgb dropped 1 gram, but no gross bleeding.  Given that significant drop in hgb if it were from GI source, there would be melena or blood in stool.  However, would consider Givens SB study if hgb continues to decline & heme positive.  Colonosopy up to date.   Plan 1) FU 4 weeks w/ Dr Servando SnareWohl  2) Continue PPI daily 3) closely follow H/H, consider non-GI sources of anemia Please call if you have any questions or concerns   Electronic Signatures: Joselyn ArrowJones, Tane Biegler L (NP)  (Signed 03-Dec-14 13:28)  Authored: Chief Complaint, VITAL SIGNS/ANCILLARY NOTES, Brief Assessment, Lab Results, Assessment/Plan   Last Updated: 03-Dec-14 13:28 by Joselyn ArrowJones, Valeria Krisko L (NP)

## 2014-04-25 NOTE — Discharge Summary (Signed)
PATIENT NAME:  Amber Meza, Amber R MR#:  [id number] DATE OF BIRTH:  January 09, 1920  DATE OF ADMISSION:  12/03/2012 DATE OF DISCHARGE:  12/05/2012  DISCHARGE DIAGNOSES: 1.  Gastrointestinal bleed.  2.  Hypertension.  3.  End-stage chronic obstructive pulmonary disease.   MEDICATION ALLERGIES: PRILOSEC, ZANTAC, DOXYCYCLINE, VIOXX, PLAVIX, FELDENE, LOTENSIN, ALPRAZOLAM, ATENOLOL, ACE INHIBITORS, NSAIDS AND ALDACTONE.   DISCHARGE MEDICATIONS: 1.  Detrol-LA 4 mg 1 tablet daily.  2.  Celexa 20 mg p.o. daily.  3.  Levoxyl 25 mcg p.o. daily.  4.  Oxycodone 5 mg p.o. t.i.d.  5.  Lasix 20 mg p.o. daily.  6.  Lyrica 75 mg p.o. b.i.d.  7.  Vitamin D3, 1 tablet daily.  8.  Protonix 40 mg p.o. b.i.d.  9.  Diltiazem 1.2 mg once a day.  10.  Percocet 5/325, 1 tablet as needed every 8 hours.   The patient advised to stop NSAIDs and also advised the daughter not to give Lasix when the blood pressure is low, like systolic less than 90.   DIET:  Mechanical soft diet initially, then regular diet.   Follow up with GI in 4 to 6 weeks.   CONSULTATIONS: GI consult with Dr. Marva PandaSkulskie.   HOSPITAL COURSE: A 81 year old female patient, followed by hospice, brought in because of coffee-ground vomiting. The patient admitted by me on December 1st.  Look at the history and physical for full details. The patient's hemoglobin was stable on admission. Admitted to the hospitalist service. The patient did have an EGD. The patient's EDG did not show any source of bleeding. The patient advised to continue PPIs, follow up with Dr. Midge Miniumarren Wohl in 4 weeks for possible small bowel study.  The patient's hemoglobin on admission was 10.8, hematocrit 33.4. The patient's stool occult was positive. Chest x-ray did not show any acute changes. The patient's hemoglobin stayed around 10. EGD showed medium-sized hiatal hernia. Stomach was normal and duodenum.  The patient was seen by palliative care regarding code status.  The patient's daughter  mentioned that she wanted her to be DNR, so DNR form signed. The patient was discharged, and she should have the hospice resumed.    Regarding her CKD,  she is stable. Hypothyroidism, continued on Synthroid.  The patient has end-stage COPD. She is on oxygen 2 liters all of the time. The patient also has a history of systolic heart failure with EF of 30% to 35%.  Did not have any swelling or shortness of breath here, so advised the daughter to give Lasix only if she gets swollen or if the BP is low, she should hold the Lasix.   The patient has history of cardiac arrest. She had a pacemaker and defibrillator placed.   History of depression. She is on Celexa. Dr. Harvie JuniorPhifer has spoken to daughter about deactivating the defibrillator. She says she will think about that, but at the time of discharge, it is not deactivated.   The patient's code status is DNR.  Discharged home in stable condition.   TIME SPENT: About 30 minutes.      ____________________________ Katha HammingSnehalatha Natividad Schlosser, MD sk:dmm D: 12/06/2012 12:10:48 ET T: 12/06/2012 12:46:04 ET JOB#: [id number]  cc: Katha HammingSnehalatha Treyvonne Tata, MD, <Dictator> Katha HammingSNEHALATHA Shaelin Lalley MD ELECTRONICALLY SIGNED 12/12/2012 22:51

## 2014-04-26 NOTE — H&P (Signed)
PATIENT NAME:  Amber Meza, Amber Meza MR#:  [id number] DATE OF BIRTH:  03-20-1920  DATE OF ADMISSION:  12/03/2012  .  EMERGENCY ROOM PHYSICIAN: Dr. Mayford Knife.  CHIEF COMPLAINT: Coffee-ground vomiting.   HISTORY OF PRESENT ILLNESS: This is a 81 year old female patient brought in because of stomach pain and coffee-ground vomiting with a history of end-stage COPD followed by home hospice, noticed that she is having coffee-ground vomiting and also abdominal pain for 2 weeks, but today, morning, the patient woke her daughter up and saying that she is nauseous and having a lot of abdominal pain, and then she started to have more coffee-ground stuff than before, and the patient's daughter gave her some Percocet. She slept, and then around 5:00 she woke up again, had another episode of coffee-ground vomiting. The patient's daughter called the hospice nurse who recommended to come to the Emergency Room.   In the ER, the patient's Gastroccult was positive for blood. The patient is being admitted for GI bleed. The patient is a very pleasant 81 year old female, answering questions appropriately. Denies any other complaints except abdominal pain. The patient states that her stomach is hurting in the epigastric area for about 2 weeks, not radiating to the back. The patient did not have any nausea or vomiting before, but she says she always has these spitting episodes but more of black vomiting today. The patient denies any diarrhea. No fever. Recently treated with Ceftin and prednisone, a 10-day course, for her bronchitis. The patient has no appetite problems and follows with the hospice nurse. The patient has some cough and she is on 3 liters of oxygen  all the time.   The patient's daughter denies any use of ibuprofen or Advil on a regular basis, but she is only on aspirin 81 mg daily.   PAST MEDICAL HISTORY:  Significant for: 1.  History of COPD on home oxygen   2.  History of tobacco abuse, quit in 1996.  3.  History  of  VFib with arrest 996 status post ICD and PPM placement.  4.  History of chronic systolic heart failure with EF of 35% to 40%. 5.  History of macular degeneration.  6.  Chronic pain.  7.  CKD, stage III.  8.  Hypothyroidism.  9.  History of gout.  10.  History of stroke.   PAST SURGICAL HISTORY: Significant for hip replacement, history of bypass surgery, history of defibrillator and pacemaker placement, and history of stent, PTCA.  ALLERGIES: SHE HAS MULTIPLE MEDICATION ALLERGIES TO DOXYCYCLINE, PRILOSEC, VIOXX, AND PRILOSEC CAUSES A RASH, BUT SHE TOLERATED THE PROTONIX THAT WAS GIVEN IN THE ER. SHE IS ALSO ALLERGIC TO FELDENE, PLAVIX, ACE INHIBITORS, AND XANAX, AND LOTENSIN, ALDACTONE.   FAMILY HISTORY: Significant for coronary artery disease.   SOCIAL HISTORY: She is living with her daughter at this time. Previous smoker, quit after 50 years of smoking. No drinking. The patient walks only a little bit with help.   MEDICATIONS:  1.  Atorvastatin 10 mg p.o. daily.  2.  Celexa 20 mg p.o. daily.  3.  Colcrys 0.6 mg p.o. b.i.d. 4.  Detrol LA 4 mg p.o. daily.  5.  Cardizem CD 120 mg once a day.  6.  Lasix 20 mg daily.  7.  Levoxyl 25 mcg p.o. daily.  8.  Lyrica 75 mg p.o. b.i.d.  9.  Percocet 5/325 one tablet every 8 hours.  10.  Protonix 40 mg p.o. b.i.d.  11.  Ocuvite.  12.  Vitamin D3.  Note that she is already on Protonix 40 mg p.o. b.i.d.   REVIEW OF SYSTEMS:  CONSTITUTIONAL: Denies any fatigue or fever. Recently treated with antibiotics for bronchitis.  EYES: No blurred vision.  ENT: No tinnitus, hard of hearing. No epistaxis. No difficulty swallowing.  RESPIRATORY: Chronic cough and COPD. The patient does have some wheezing on and off. Recently finished antibiotics.  CARDIOVASCULAR: No chest pain. No orthopnea. The patient does not have any pedal edema at this time.  GASTROINTESTINAL: Has abdominal pain and also coffee-ground vomiting. No constipation.  GENITOURINARY:  No dysuria.  ENDOCRINE: No polyuria or nocturia.  HEMATOLOGIC: No anemia or easy bruising.  INTEGUMENTARY: No skin rashes.  MUSCULOSKELETAL: Occasional joint pains.  NEUROLOGIC: No numbness or weakness. No dysarthria. PSYCHIATRIC: No anxiety or insomnia. No depression.   PHYSICAL EXAMINATION:  VITAL SIGNS: Temperature 98.3, heart rate 102. Blood pressure 102/44, sats 97% on room air. GENERAL: Alert, awake, oriented. Elderly female answering questions appropriately.  HEENT: Head normocephalic, atraumatic. Pupils equally reacting to light. Extraocular movements are intact. The patient has no scleral icterus.NOSE: No lesions.  Ear;no tympanic membrane congeston,no external auditory lesions thorat;no oropharyngeal erythema NECK: Supple. No JVD. No carotid bruit. Normal range of motion,thyroid  not enlarged. >>. RESPIRATORY:  Bilateral expiratory wheezes present, which is mild. The patient not using accessory muscles for respiration.  CARDIOVASCULAR: S1, S2 regular. No murmurs. The patient has no peripheral edema. Pulses equal at carotid, femoral and pedal pulses.  GASTROINTESTINAL: Abdomen: The patient has mild epigastric tenderness present. No rebound tenderness. No hernias. Midline abdominal scar present. Bowel sounds are present in all four quadrants and the patient has no rigidity.  MUSCULOSKELETAL: No pathology of the digits and nails.  EXTREMITIES: Move x4. No tenderness or effusion.  SKIN: Inspection is normal, well hydrated. No diaphoresis.  NEUROLOGICAL: Cranial nerves II through XII intact. Power 5/5 in upper and lower extremities. Sensation is intact. Deep tendon reflexes 2+ bilaterally.   LABORATORY DATA: Sodium 140, potassium 4.4, chloride 105, bicarb 28, BUN 39, creatinine 1.45, glucose 147. INR is 1. Lipase 110. WBC 20.9, hemoglobin 10.8, hematocrit 33.4, platelets 180.   Chest x-ray: No acute cardiopulmonary disease. Left-sided pacemaker present.   Gastroccult is positive.    EKG shows normal sinus rhythm, 98 beats per minute. No ST-T changes.   ASSESSMENT AND PLAN:  1.  The patient is a 81 year old female patient with coffee-ground vomiting, possibly related to gastritis versus ulcer. The patient likely have upper gastrointestinal  bleed. Admit her to off-unit telemetry. Continue to monitor CBC q.12 and start her on  protonix  drip and hold the aspirin. Obtain gastroenterology consult.   The patient is a 81 year old female with multiple medical problems of chronic obstructive pulmonary disease, which is end-stage. The patient will have gastroenterology evaluation. Discussed this plan with the patient and the patient's daughter. At this time, she is hemodynamically stable. No indication for blood transfusion.   2.  End-stage chronic obstructive pulmonary disease. Continue (> oxygen along with her Spiriva and DuoNebs. The patient recently finished prednisone and antibiotics. She has leukocytosis, likely secondary to recent prednisone.   2.  Chronic kidney disease, stage III. Hold Lasix due to gastrointestinal bleed at this time and continue to follow creatinine.   3.  Hypothyroidism. Continue Synthroid.   4.  History of coronary artery disease. Hold aspirin but continue atorvastatin.   5.  History of gout. Continue colchicine.  6.  Depression. Continue Celexa.   TIME SPENT: About 60 minutes.  CODE STATUS: FULL CODE.  The patient's condition discussed with the daughter and also the patient's other diagnoses include history of chronic systolic heart failure with EF of 30% to 35%, stable at this time. Hold Lasix because of her GI bleed. History of Vfib  s/p  ICD and PPM   History of AFib before, on Xarelto, and also she was also on Coumadin but they were all stopped because of history of falls. Right now she is only on aspirin.    consult  palliative care again.   ____________________________ Katha HammingSnehalatha Shizuko Wojdyla, MD sk:np D: 12/03/2012 16:03:17  ET T: 12/03/2012 18:29:10 ET JOB#: [id number]  cc: Katha HammingSnehalatha Rockland Kotarski, MD, <Dictator> Katha HammingSNEHALATHA Michalle Rademaker MD ELECTRONICALLY SIGNED 01/05/2013 17:12

## 2014-04-27 NOTE — Consult Note (Signed)
General Aspect 81 year old female with CAD, ischemic cardiomyopathy, s/p ICD, managed by cardiology at [facility], recent atrial flutter, chronic systolic congestive heart failure who presents to the hospital with a two to three-day history of gradually progressive shortness of breath.  Cardiology was consulted for acute on chronic CHF, atrial flutter.   She reports feeling very anxious recently. her daughter is in the hospital with a knee replacement. She is home alone. Yesterday, she became more acutely SOB. She denies any fever or cough.   shortness of breath is worse if she lays flat or exerts herself, and better with rest and sitting up.  She denies noticing any swelling in her legs. She denies any chest pain.  She has had nose bleeds on warfarin and was changed to xarelto several weeks ago. She reports that she was supposed to have a cardioversion in teh near future. Previous "ultrasound showed a clot in my heart".    Dr. Sandy Salaam is her primary cardiologist.    In the ER she was diagnosed with acute on chronic systolic congestive heart failure, possible pneumonia, possible chronic obstructive pulmonary disease exacerbation by the ER physician.  She uses oxygen on a regular basis at home. More at night, sometimes in the day. She has been using it during the daytime recently on a regular basis.    Present Illness . PAST MEDICAL AND SURGICAL HISTORY:  1. Ischemic cardiomyopathy status post coronary artery bypass grafting and coronary stenting. 2. Chronic systolic congestive heart failure currently in acute decompensation.  Last ejection fraction 35% status post AICD. 3. Atrial fibrillation.  The patient is on Xarelto.  Has history of nosebleeds. 4. Macular degeneration. 5. Hypothyroidism. 6. Dyslipidemia. 7. Bladder incontinence. 8. Osteoporosis. 9. Hypertension. 10. Depression. 11. Chronic pain. 12. Chronic kidney disease stage III. 13. Gastroesophageal reflux disease. 14. History  of cerebrovascular accident. 15. Questionable history of  V-fib status post  cardiac arrest and AICD placement. 16. Hip replacement times two.  SURGERIES: 1. Hip replacement times two. 2. Coronary artery bypass grafting. 3. AICD placement. 4. Coronary stenting.  FAMILY HISTORY:  Positive for coronary artery disease.  Social HISTORY:  Quit smoking more than ten years ago.   No alcohol use. Lives independant, beneath daughter   Physical Exam:   GEN well developed, well nourished, no acute distress    HEENT pink conjunctivae    NECK supple    RESP postive use of accessory muscles  rhonchi    CARD Irregular rate and rhythm  Murmur    Murmur Systolic    ABD soft    LYMPH negative neck    EXTR negative edema    SKIN normal to palpation    NEURO cranial nerves intact, motor/sensory function intact    PSYCH alert, A+O to time, place, person, good insight   Review of Systems:   Subjective/Chief Complaint SOB, cough    General: Weakness  anxious    Skin: No Complaints    ENT: No Complaints    Eyes: No Complaints    Neck: No Complaints    Respiratory: Frequent cough  Short of breath  Wheezing  Sputum    Cardiovascular: Palpitations  Dyspnea    Gastrointestinal: No Complaints    Genitourinary: No Complaints    Vascular: No Complaints    Musculoskeletal: No Complaints  Leg weakness    Neurologic: No Complaints    Hematologic: No Complaints    Endocrine: No Complaints    Psychiatric: No Complaints  Review of Systems: All other systems were reviewed and found to be negative    Medications/Allergies Reviewed Medications/Allergies reviewed        Admit Diagnosis:   CHF: 11-Apr-2011, Active, CHF      Admit Reason:   Congestive heart failure, unspecified: (428.0) 11-Apr-2011, Active, ICD9, Congestive heart failure, unspecified, Auto-generated by [facility] Based on Admission Order   CHF (congestive heart failure): (428.0) 11-Apr-2011, Active, ICD9,  Congestive heart failure, unspecified  Home Medications: Medication Instructions Status  Detrol LA 4 mg oral capsule, extended release 1 cap(s) orally once a day Active  levothyroxine 25 mcg (0.025 mg) oral tablet 1 tab(s) orally once a day Active  Aspir 81 oral delayed release tablet 1 tab(s) orally once a day Active  Calcium 600+D 600 mg-200 intl units oral tablet tab(s) orally once a day Active  morphine 30 mg oral capsule 0.5 tab(s) orally , As Needed, only if pain does not improve with oxycodone Active  Vitamin B12 250 mcg oral tablet 1 tab(s) orally once a day Active  Tylenol Caplet Extra Strength 500 mg oral tablet tab(s) orally every 6 hours, As Needed- for Pain  Active  albuterol 2 puff(s) inhaled 2 times a day, As Needed Active  Proventil 2.5 milligram(s) inhaled every 6 hours, As Needed- for Shortness of Breath  Active  atorvastatin 10 mg oral tablet 1 tab(s) orally once a day (at bedtime) Active  citalopram 20 mg oral tablet 1 tab(s) orally once a day Active  colchicine 0.6 mg oral tablet tab(s) orally 2 times a day Active  diltiazem 120 mg oral tablet tab(s) orally once a day Active  ergocalciferol 50,000 intl units (1.25 mg) oral capsule cap(s) orally once a week Active  Lasix 20 mg oral tablet 1 tab(s) orally once a day Active  metoprolol 25 milligram(s) orally 2 times a day Active  nitroglycerin 0.4 milligram(s) sublingual , As Needed- for Chest Pain  Active  oxycodone 5 mg oral tablet tab(s) orally every 4 hours, As Needed Active  Protonix 40 mg oral delayed release tablet 1 tab(s) orally 2 times a day Active  Paxil 20 mg oral tablet 25 milligram(s) orally every other day Active  Symbicort 2 puff(s) inhaled 2 times a day Active  Lyrica 50 mg oral capsule 1 cap(s) orally 2 times a day Active  Spiriva 18 mcg inhalation capsule 1 each inhaled once a day Active  Atrovent HFA 17 mcg/inh inhalation aerosol 2 puff(s) inhaled 4 times a day Active  Xarelto 15 mg oral tablet 1  tab(s) orally once a day (in the evening) Active   EKG:   Interpretation EKG shows atrial flutter with rate of 97 bpm, consider old anterior MI, ST and T wave ABN V4 to V6, I and AVL   Radiology Results: XRay:    07-Apr-13 21:25, Chest Portable Single View   Chest Portable Single View    REASON FOR EXAM:    Shortness of Breath  COMMENTS:       PROCEDURE: DXR - DXR PORTABLE CHEST SINGLE VIEW  - Apr 10 2011  9:25PM     RESULT: Comparison: 09/06/2010    Findings:  Heart and mediastinum are stable. Wires are seen from multilead AICD.   There are mild heterogeneous opacities at the lung bases, left greater   than right. There is a small left pleural effusion. Prior median   sternotomy and CABG.    IMPRESSION:   1. Small left pleural effusion.  2. Mild heterogeneous basilar opacities  likely secondary to atelectasis.   Infection is not excluded. However, followup PA and lateral chest   radiographs are recommended.          Verified By: Lewie Chamber, M.D., MD    Prilosec: Rash  Zantac: GI Distress  Vioxx: Rash  Doxycycline: Hives  Feldene: Other  Clopidogrel: Other  Alprazolam: Unknown  Atenolol: Unknown  Lotensin: Unknown  Ace Inhibitors: Other  NSAIDS: Unknown  Spironolactone: Unknown    Impression 81 year old female with CAD, ischemic cardiomyopathy, s/p ICD, managed by cardiology at [facility], recent atrial flutter, chronic systolic congestive heart failure who presents to the hospital with a two to three-day history of gradually progressive shortness of breath.  Found to have acute on chronic CHF, atrial flutter.   A/P: 1)SOB: Acute on chronic systolic CHF, unable to exclude COPD flare, possible bronchitis (thick cough for a few weeks) Elevated BNP, CXR with small effusion. EF depressed by hx, now also with atrial flutter --Would continue gentle diuresis --Continue outpt medication regimen: asa, lipitor, metoprolol, diltiazem  (rate control) --echo  pending  2) Atrial flutter Would continue xarelto Recent hx of cardiac thrombus per the patient Will evaluate echo for thrombus. Will likely defer cardioversion to outpt cardiologist Uncertain if she has missed doses of xarelto with recent events.  3) CAD, ischemic cardiomyopathy cardiac enz negative x 3 Continue ASA and b-blocker, statin Not on ACE inhibitor? Can probabably defer to primary cardiology (outpt)  4) COPD: Thick cough concerning for bronchitis, copd exacerbation. on ABX  5) Dispo: very nervous without daughter at home Daughter in the hospital with knee replacement. Social worker consult?   Electronic Signatures: Julien Nordmann (MD)  (Signed 08-Apr-13 14:21)  Authored: General Aspect/Present Illness, History and Physical Exam, Review of System, Health Issues, Home Medications, EKG , Radiology, Allergies, Impression/Plan   Last Updated: 08-Apr-13 14:21 by Julien Nordmann (MD)

## 2014-04-27 NOTE — Discharge Summary (Signed)
PATIENT NAME:  Amber Meza, Amber Meza MR#:  [id number] DATE OF BIRTH:  07-20-1920  DATE OF ADMISSION:  04/11/2011 DATE OF DISCHARGE:  04/14/2011  DIAGNOSES:  1. Acute on chronic congestive heart failure exacerbation.  2. Possible pneumonia. 3. Acute on chronic respiratory failure with mild chronic obstructive pulmonary disease exacerbation.  4. Hypotension, resolved. 5. Coronary artery disease status post coronary artery bypass graft. 6. Chronic atrial fibrillation. 7. Chronic kidney disease.  8. Hypertension. 9. Hyperlipidemia. 10. Hypothyroidism. 11. Depression.    DISPOSITION: Patient is being discharged home. Home health with [facility] has been resumed.   DIET: Low sodium.   ACTIVITY: As tolerated.   FOLLOW UP: Follow up with her PCP and cardiologist, Dr. Sandy Salaam, in 1 to 2 weeks after discharge.   DISCHARGE MEDICATIONS:  1. Cyanocobalamin 500 mcg daily. 2. Cardizem 120 mg b.i.d.  3. Protonix 40 mg daily.  4. MS Contin 50 mg at bedtime p.r.n.  5. Lyrica 50 mg q.5:00 p.m.  6. Albuterol metered dose inhaler 1 to 2 puffs every six hours p.r.n.  7. Levaquin 500 mg daily for three days.  8. DuoNebs q.6 hours p.r.n.  9. Lasix 20 mg daily. Patient is to stop the Lasix on 04/16/2011.  10. Xarelto 15 mg daily.  11. Spiriva 18 mcg inhaled daily.  12. Symbicort 2 puffs b.i.d.  13. Paxil 20 mg daily.  14. Oxycodone 5 mg every four hours p.r.n.  15. Nitroglycerin 0.5 mg sublingual as needed for chest pain. 16. Lopressor 25 mg b.i.d.  17. Ergocalciferol 50,000 international units once a week.  18. Colchicine 0.6 mg b.i.d.  19. Citalopram 20 mg daily. 20. Lipitor 10 mg daily.  21. Tylenol p.r.n.  22. Calcium with vitamin D 600/200 once a day. 23. Aspirin 81 mg daily.  24. Synthroid 25 mcg daily.  25. Detrol LA 4 mg daily.   CONSULTATION: Cardiology consultation with Dr. Mariah Milling.   LABORATORY, DIAGNOSTIC, AND RADIOLOGICAL DATA: Chest x-ray on admission: Small left pleural effusion.  Heterogenous basilar opacity, likely secondary to atelectasis. Repeat chest x-ray PA and lateral: Mild changes of pulmonary vascular congestion, small bibasilar pleural effusion, mild stable cardiac enlargement. Echo rhythm is atrial flutter, ejection fraction 30% to 35%. Right ventricle is mildly dilated, right ventricular systolic function is mildly reduced. The left atrium is mildly dilated. The right atrium is mildly dilated. There is mild to moderate MR, moderate TR. Right ventricular systolic pressure is 40 to 50 mmHg. Normal white count, hemoglobin 10.7. Platelets 160 to 139. Creatinine normal her admission, went up as high as 2.0 and 1.74 by the time of discharge. Normal cardiac enzymes. TSH 1.08.   HOSPITAL COURSE: Patient is an 81 year old female with past medical history of chronic respiratory failure on home O2 due to chronic obstructive pulmonary disease, hypertension, hyperlipidemia, coronary artery disease, history of ventricular fibrillation, cardiac arrest, stage IV chronic kidney disease who presented with shortness of breath.  1. Acute on chronic systolic congestive heart failure exacerbation. Patient was diuresed with IV Lasix with good response. She diuresed 3 liters since admission and was approximately 2.3 liters negative balance. However, as a result of aggressive diuresis patient developed some acute on chronic renal insufficiency therefore her Lasix was held for a few days with improvement in patient's renal function. Patient has been advised to restart Lasix on 04/16/2011. Her blood pressure was borderline low therefore her nitrate was also discontinued. She is currently on treatment with beta blocker. Patient is not on any ACE inhibitor at her  baseline and it was felt best not to start her on ACE inhibitor given her renal insufficiency. Her repeat echo showed LVEF 30% to 35% with mild to moderate MR, moderate TR and mild pulmonary hypertension.  2. Possible pneumonia. There was some  atelectasis on the patient's chest x-ray. Blood cultures were not drawn on admission. She was empirically treated with antibiotics. Patient has chronic cough and shortness of breath.  3. Chronic obstructive pulmonary disease exacerbation. Patient had mild chronic obstructive pulmonary disease exacerbation during the hospitalization and was treated with empiric antibiotics, oxygen, Symbicort, Spiriva and metered dose inhaler in addition to a steroid taper which she has completed. 4. Hypertension. As a result of over diuresis patient became hypotensive but was asymptomatic. Her blood pressure stabilized once her Lasix was held.  5. Coronary artery disease status post coronary artery bypass graft. Serial cardiac enzymes were negative. Patient is on medical management as per cardiology.  6. Chronic atrial fibrillation. Heart rate is currently controlled on beta blocker and calcium channel blocker. She is on Xarelto for anticoagulation. As per the patient's family she is supposed to have cardioversion by her primary cardiologist as an outpatient.  7. Chronic kidney disease. Patient's creatinine ranged from 1.4 to 1.8. As a result of diuresis her creatinine worsened. It is gradually returning to normal after holding Lasix. Patient has been advised to restart Lasix in two days after discharge.  8. Hyperlipidemia. Patient is on statin.  9. Hypothyroidism. Patient's TSH is normal.  10. Patient is being discharged home in a stable condition with home health.   TIME SPENT: 45 minutes.   ____________________________ Darrick MeigsSangeeta Letia Guidry, MD sp:cms D: 04/14/2011 15:42:28 ET T: 04/17/2011 13:27:00 ET JOB#: [id number]  cc: Darrick MeigsSangeeta Lennie Vasco, MD, <Dictator> Darrick MeigsSANGEETA Cherilynn Schomburg MD ELECTRONICALLY SIGNED 04/23/2011 7:16

## 2014-04-27 NOTE — H&P (Signed)
PATIENT NAME:  Amber Meza, SALLIS MR#:  [id number] DATE OF BIRTH:  12/28/1920  DATE OF ADMISSION:  04/11/2011  PRIMARY CARE PHYSICIAN:  Dr. Wallene Huh CARDIOLOGIST:  Dr. Sandy Salaam  PAST MEDICAL AND SURGICAL HISTORY:  1. Ischemic cardiomyopathy status post coronary artery bypass grafting and coronary stenting. 2. Chronic systolic congestive heart failure currently in acute decompensation.  Last ejection fraction 35% status post AICD. 3. Atrial fibrillation.  The patient is on Xarelto.  Has history of nosebleeds. 4. Macular degeneration. 5. Hypothyroidism. 6. Dyslipidemia. 7. Bladder incontinence. 8. Osteoporosis. 9. Hypertension. 10. Depression. 11. Chronic pain. 12. Chronic kidney disease stage III. 13. Gastroesophageal reflux disease. 14. History of cerebrovascular accident. 15. Questionable history of  V-fib status post  cardiac arrest and AICD placement. 16. Hip replacement times two.  SURGERIES: 1. Hip replacement times two. 2. Coronary artery bypass grafting. 3. AICD placement. 4. Coronary stenting.  HISTORY OF PRESENT ILLNESS:  This is a pleasant 81 year old female with the above-dictated past medical and surgical history who has history of atrial fibrillation/flutter, chronic systolic congestive heart failure along with coronary artery disease who presents to the hospital with a two to three-day history of gradually progressive shortness of breath.  She denies any fever or cough.  She does say that her shortness of breath is worse if she lays flat or exerts herself, and better with rest and sitting up.  She denies noticing any swelling in her legs, though she does say that chronically her left leg has been somewhat bigger than the right leg and she has noted no change in this pattern.  She denies any chest pain.  She does have palpitations from time and time and contributes this to her atrial fibrillation and flutter.  She also states that she was due for cardioversion as an outpatient in the  coming few days by Dr. Sandy Salaam who is her primary cardiologist.    In the ER she was diagnosed with acute on chronic systolic congestive heart failure, possible pneumonia, possible chronic obstructive pulmonary disease exacerbation by the ER physician, and I was called to admit the patient.  PAST MEDICAL HISTORY/ PAST SURGICAL HISTORY:  As above.  REVIEW OF SYSTEMS:  Full 10-point review of systems was obtained.  Except as dictated, all of the review of systems is negative.  In my interview the patient denies any fever or chills.  Denies any headache, nausea, or vomiting. No new problems with vision or hearing.  No problem swallowing food or liquids.  No neck pain.  No neck stiffness. Denies any cough or chest pain.  Does have intermittent palpitations, shortness of breath as dictated in the history of present illness along with positive orthopnea.  Denies any abdominal pain.  No blood in stool or urine.  Bowel movements have been regular.  No dysuria.  No new weakness, tingling, or numbness in the extremities. No recent weight gain or weight loss.  No new mental stressors.  FAMILY HISTORY:  Positive for coronary artery disease.  PERSONAL HISTORY:  Quit smoking more than ten years ago.   No alcohol use.  HOME MEDICATIONS: 1. Xarelto 50 mg p.o. at bedtime. 2. Vitamin B12 250 mcg p.o. daily. 3. Symbicort 2 puffs b.i.d. 4. Spiriva 18 mcg, one inhalation daily. 5. Proventil 2.5 mg inhalation q. 6 p.r.n.  6. Protonix 40 mg p.o. b.i.d. 7. Paxil 25 mg p.o. daily. 8. Oxycodone 5 mg q. 4 p.r.n.  9. Nitroglycerin 0.4 mg sublingual p.r.n.  10. Morphine 15 mg p.o. b.i.d.  p.r.n.  11. Lopressor 25 mg p.o. b.i.d. 12. Lyrica 50 mg p.o. daily. 13. Synthroid 25 mcg p.o. daily. 14. Lasix 20 mg p.o. daily. 15. Ergocalciferol 50,000 international units once a week. 16. Diltiazem 120 mg p.o. once daily. 17. Detrol LA 4 mg p.o. daily. 18. Colchicine 0.6 mg p.o. daily. 19. Citalopram 20 mg p.o.  daily. 20. Calcium 600 plus vitamin D one pill p.o. daily. 21. Atorvastatin 10 mg p.o. daily. 22. Aspirin 81 mg p.o. daily. 23. Albuterol 2 puffs inhaled twice daily p.r.n.   ALLERGIES: ACE inhibitors cause renal failure, alprazolam, atenolol, Plavix, doxycycline, Feldene, Lotensin, NSAIDs, Prilosec, Aldactone, Vioxx, and Zantac.  PHYSICAL EXAMINATION: VITAL SIGNS:  Temperature 96.1, pulse 85, respirations 20, blood pressure 149/81, 97% on two liters nasal cannula oxygen.  The patient does wear two-liter nasal cannula oxygen at baseline.  GENERAL:  Elderly, obese Caucasian female lying in her hospital bed in no apparent discomfort wearing two-liter nasal cannula oxygen.  HEENT:  Normocephalic, atraumatic head.  Pupils equal, round, reactive to light.  Pink and moist tongue and throat.  No scleral icterus.  NECK:  No jugular venous distention. Supple neck.  CNS:  Oriented times three. All cranial nerves intact.  No focal neurological deficits.   PSYCH:  Insight is intact. Not suicidal or homicidal.  CHEST:  Chest wall movement bilaterally symmetrical.  Good air movement bilaterally.  She does have bilateral diffuse rales.  Jugular venous distention could not be appreciated.  CARDIOVASCULAR:  Irregularly irregular rate, rhythm, normal S1, S2.  No gallops or murmurs.  ABDOMEN:  Obese, soft, positive bowel sounds.  Nontender.  EXTREMITIES:  No cyanosis or clubbing.  Chronic left leg slightly bigger than right leg with trace bipedal edema. Old bypass surgery scar on chest wall and left leg.   LYMPH:  No palpable cervical lymph nodes.  LABORATORY DATA:  White count 6.4, hemoglobin 10.7, hematocrit 33.  Platelets 139.  Sodium 143, potassium 4.4.  Chloride 105, bicarb 26, BUN 15, creatinine 1.2.  Glucose 145.  AST 38, ALT 26, alkaline phosphatase 63, total bilirubin 0.6.  BNP [id number].  CK 61, CK-MB 1.5, troponin less than 0.02.  Chest x-ray shows left lower lobe pleural effusion, AICD,  bilateral slight vascular congestion.    EKG:  Questionable rhythm, likely atrial fibrillation, rate of 97 beats per minute.  T-wave inversion in lateral leads, which are more pronounced than the last EKG on the chart.  ASSESSMENT AND PLAN: 1. Acute on chronic systolic congestive heart failure in a patient with last known ejection fraction of 35%, history of ischemic cardiomyopathy, status post  AICD.   The plan is to admit the patient on a telemetry bed.  She will get IV Lasix now and in the morning.  We will continue her home dose beta-blocker and Cardizem.  She has allergies to ACE and ARB.  Fluid restriction, nitro paste, oxygen, elevate head of the bed.  Obtain echocardiogram, serial cardiac enzymes. Obtain cardiology input.  The patient might benefit from discontinuation of Cardizem as her ejection fraction is reduced.  Consider digoxin plus or minus increasing Lopressor dose.  2. History of hypertension and dyslipidemia.  No acute issues.  Home medications will be continued with blood pressure monitoring, outpatient cholesterol monitoring. 3. History of atrial fibrillation.  No acute issues. We will continue rate-controlling agents Lopressor and Cardizem for now.  She will be on telemetry.  Xarelto will be continued.  Monitor creatinine clearance and adjust Xarelto dosing as needed. Cardiology input  in the morning along with echocardiogram and TSH. 4. History of hypothyroidism.  No acute issues.  Continue home dose Synthroid.  We will check TSH. 5. History of chronic pain.  We will continue home pain medications morphine and p.r.n. Lortab. 6. History of depression.  No acute issues.  Paxil and Celexa will be continued per home dose. 7. History of vitamin B12 deficiency.  We will continue home dose 250 mcg of p.o. vitamin B12 supplementation daily.  Outpatient B12 level monitoring. 8. History of chronic obstructive pulmonary disease.  No evidence of exacerbation on physical exam.  The patient has  no wheezing.  Continue baseline two-liter nasal cannula oxygen, nebulizer treatments p.r.n.  9. The patient has received Levaquin in the ER.  However, I do not think she has any evidence of pneumonia clinically or chronic obstructive pulmonary disease exacerbation.  No further IV antibiotics at this time. 10. Xarelto for deep vein thrombosis prophylaxis.  Protonix for peptic ulcer disease prophylaxis. 11. The patient wishes to be FULL CODE.  Son is witness.  TOTAL TIME:  55 minutes.   ____________________________ Stanford ScotlandPrashant K. Thedore MinsSingh, MD pks:bjt D: 04/11/2011 00:45:55 ET T: 04/11/2011 09:28:38 ET JOB#: [id number]  cc: Bess HarvestPrashant K. Thedore MinsSingh, MD, <Dictator> Dr. Debroah Ballerarew Daymon Hora K [facility] LtdINGH MD ELECTRONICALLY SIGNED 04/11/2011 11:00
